# Patient Record
Sex: Female | Born: 1970
Health system: Southern US, Community
[De-identification: ages and names within clinical notes are randomized; demographics above are authoritative.]

## PROBLEM LIST (undated history)

## (undated) DIAGNOSIS — G4700 Insomnia, unspecified: Secondary | ICD-10-CM

## (undated) DIAGNOSIS — I10 Essential (primary) hypertension: Secondary | ICD-10-CM

## (undated) DIAGNOSIS — K529 Noninfective gastroenteritis and colitis, unspecified: Secondary | ICD-10-CM

## (undated) DIAGNOSIS — F419 Anxiety disorder, unspecified: Secondary | ICD-10-CM

## (undated) DIAGNOSIS — N289 Disorder of kidney and ureter, unspecified: Secondary | ICD-10-CM

## (undated) DIAGNOSIS — F4310 Post-traumatic stress disorder, unspecified: Secondary | ICD-10-CM

## (undated) HISTORY — PX: HIP SURGERY: SHX245

## (undated) HISTORY — PX: TUBAL LIGATION: SHX77

## (undated) HISTORY — PX: BACK SURGERY: SHX140

---

## 2019-12-19 ENCOUNTER — Other Ambulatory Visit: Payer: Self-pay

## 2019-12-20 ENCOUNTER — Telehealth (INDEPENDENT_AMBULATORY_CARE_PROVIDER_SITE_OTHER): Payer: Self-pay | Admitting: Medical

## 2019-12-20 DIAGNOSIS — I10 Essential (primary) hypertension: Secondary | ICD-10-CM | POA: Insufficient documentation

## 2019-12-20 DIAGNOSIS — F32A Depression, unspecified: Secondary | ICD-10-CM

## 2019-12-20 DIAGNOSIS — F419 Anxiety disorder, unspecified: Secondary | ICD-10-CM

## 2019-12-20 DIAGNOSIS — Q639 Congenital malformation of kidney, unspecified: Secondary | ICD-10-CM

## 2019-12-20 DIAGNOSIS — F329 Major depressive disorder, single episode, unspecified: Secondary | ICD-10-CM

## 2019-12-20 MED ORDER — FLUTICASONE PROPIONATE 50 MCG/ACT NA SUSP: 2.0000 | Freq: Every day | NASAL | 1 refills | Status: DC

## 2019-12-20 MED ORDER — TRAZODONE HCL 50 MG PO TABS: ORAL_TABLET | ORAL | 3 refills | Status: DC

## 2019-12-20 NOTE — Patient Instructions (Addendum)
For your htn- need to get bp level. Please call and give [place] bp and pulse update. She has tabs and refills.   For depression and insomnia continue cymbalta and continue trazadone. Will refill dose you have been using.  For anxiety- you need to just take xanax 1 mg twice daily prn. For me to fill in the future we needs uds and for you to sign controlled med contract  For pain hip and back limited options as your are on high dose benzo so narcotics usually not recommended. Will need to consider other option.   For allergies by description. Continue allegra and rx flonase.  Follow up 2 weeks or as needed

## 2019-12-20 NOTE — Progress Notes (Addendum)
Subjective:    Patient ID: Wendy Cooper, female    DOB: 1970/11/27, 49 y.o.   MRN: [id number]  HPI  Virtual Visit via Video Note  I connected with Wendy Cooper on 01/05/20 at  1:40 PM EDT by a video enabled telemedicine application and verified that I am speaking with the correct person using two identifiers.  Location: Patient: home Provider: office   I discussed the limitations of evaluation and management by telemedicine and the availability of in person appointments. The patient expressed understanding and agreed to proceed.     Follow Up Instructions:    I discussed the assessment and treatment plan with the patient. The patient was provided an opportunity to ask questions and all were answered. The patient agreed with the plan and demonstrated an understanding of the instructions.   The patient was advised to call back or seek an in-person evaluation if the symptoms worsen or if the condition fails to improve as anticipated.  I   Mackie Pai, PA-C   Pt just moved to [place]. Pt friend recommended her.  Pt has htn but has no bp cuff at home. She will update me on bp level when family member comes back. Lisinopril/hctz 20-12.5 mg q day.  Pt has rt hip pain for 8 years. Was on oxycodone in recent past but states no med for about 2 months. Lumbar surgery in past. Hip surgery but no replacement. Surgery was for hip fracture playing soccer years ago.  Anxiety history- Pt had been on and off of that for years. Had breaks periodically. It is 2 mg tablet. This was given by [facility]. She has 58 tabs 2 refill and states bottle states bid. She is taking 1/2 tab bid prn anxiety.  Pt has history of depression and has some PTSD and is on cymbalta 60 mg bid and trazadone 100 mg at night for sleep.  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   Review of Systems  Constitutional: Negative for chills, fatigue and fever.  HENT: Positive for congestion.          Runny nose. Tested negative for covid 11-26-2019. Taking allegra otc.   Respiratory: Negative for cough, chest tightness, shortness of breath and wheezing.   Cardiovascular: Negative for chest pain and palpitations.  Gastrointestinal: Negative for abdominal pain.  Musculoskeletal:       See hpi.  Skin: Negative for rash.  Neurological: Negative for dizziness, weakness, numbness and headaches.  Hematological: Negative for adenopathy. Does not bruise/bleed easily.  Psychiatric/Behavioral: Positive for dysphoric mood. The patient is nervous/anxious.     No past medical history on file.   Social History   Socioeconomic History  . Marital status: Divorced    Spouse name: Not on file  . Number of children: Not on file  . Years of education: Not on file  . Highest education level: Not on file  Occupational History  . Not on file  Tobacco Use  . Smoking status: Not on file  Substance and Sexual Activity  . Alcohol use: Not on file  . Drug use: Not on file  . Sexual activity: Not on file  Other Topics Concern  . Not on file  Social History Narrative  . Not on file   Social Determinants of Health   Financial Resource Strain:   . Difficulty of Paying Living Expenses:   Food Insecurity:   . Worried About Charity fundraiser in the Last Year:   .  Ran Out of Food in the Last Year:   Transportation Needs:   . Freight forwarder (Medical):   Marland Kitchen Lack of Transportation (Non-Medical):   Physical Activity:   . Days of Exercise per Week:   . Minutes of Exercise per Session:   Stress:   . Feeling of Stress :   Social Connections:   . Frequency of Communication with Friends and Family:   . Frequency of Social Gatherings with Friends and Family:   . Attends Religious Services:   . Active Member of Clubs or Organizations:   . Attends Banker Meetings:   Marland Kitchen Marital Status:   Intimate Partner Violence:   . Fear of Current or Ex-Partner:   . Emotionally Abused:    Marland Kitchen Physically Abused:   . Sexually Abused:      No family history on file.  Not on File  No current outpatient medications on file prior to visit.   No current facility-administered medications on file prior to visit.    There were no vitals taken for this visit.      Objective:   Physical Exam  General-no acute distress, pleasant, oriented. Lungs- on inspection lungs appear unlabored. Neck- no tracheal deviation or jvd on inspection. Neuro- gross motor function appears intact.             Assessment & Plan:   For your htn- need to get bp level. Please call and give [place] bp and pulse update. She has tabs and refills.   For depression and insomnia continue cymbalta and continue trazadone. Will refill dose you have been using.  For anxiety- you need to just take xanax 1 mg twice daily prn. For me to fill in the future we needs uds and for you to sign controlled med contract  For pain hip and back limited options as your are on high dose benzo so narcotics usually not recommended. Will need to consider other option.   For allergies by description. Continue allegra and rx flonase.  Follow up 2 weeks. or as needed  Time spent with patient today was 40   minutes which consisted  discussing diagnosis, work up, treatment and documentation.

## 2020-01-13 ENCOUNTER — Emergency Department (HOSPITAL_COMMUNITY): Payer: Medicaid - Out of State

## 2020-01-13 ENCOUNTER — Other Ambulatory Visit: Payer: Self-pay

## 2020-01-13 ENCOUNTER — Encounter (HOSPITAL_COMMUNITY): Payer: Self-pay

## 2020-01-13 ENCOUNTER — Emergency Department (HOSPITAL_COMMUNITY)
Admission: EM | Admit: 2020-01-13 | Discharge: 2020-01-13 | Disposition: A | Discharge status: Home / Self Care | Payer: Medicaid - Out of State | Attending: Emergency Medicine | Admitting: Emergency Medicine

## 2020-01-13 DIAGNOSIS — F17210 Nicotine dependence, cigarettes, uncomplicated: Secondary | ICD-10-CM | POA: Diagnosis not present

## 2020-01-13 DIAGNOSIS — I10 Essential (primary) hypertension: Secondary | ICD-10-CM | POA: Insufficient documentation

## 2020-01-13 DIAGNOSIS — M79604 Pain in right leg: Secondary | ICD-10-CM | POA: Diagnosis not present

## 2020-01-13 DIAGNOSIS — Z91040 Latex allergy status: Secondary | ICD-10-CM | POA: Insufficient documentation

## 2020-01-13 HISTORY — DX: Anxiety disorder, unspecified: F41.9

## 2020-01-13 HISTORY — DX: Post-traumatic stress disorder, unspecified: F43.10

## 2020-01-13 HISTORY — DX: Insomnia, unspecified: G47.00

## 2020-01-13 HISTORY — DX: Disorder of kidney and ureter, unspecified: N28.9

## 2020-01-13 HISTORY — DX: Essential (primary) hypertension: I10

## 2020-01-13 MED ORDER — IBUPROFEN 400 MG PO TABS: 400.0000 mg | ORAL_TABLET | Freq: Three times a day (TID) | ORAL | 0 refills | Status: AC

## 2020-01-13 MED ORDER — HYDROCODONE-ACETAMINOPHEN 5-325 MG PO TABS
1.0000 | ORAL_TABLET | Freq: Once | ORAL | Status: AC
  Administered 2020-01-13: 1 via ORAL
  Filled 2020-01-13: qty 1

## 2020-01-13 MED ORDER — HYDROCODONE-ACETAMINOPHEN 5-325 MG PO TABS: 1.0000 | ORAL_TABLET | Freq: Four times a day (QID) | ORAL | 0 refills | Status: DC | PRN

## 2020-01-13 MED ORDER — IBUPROFEN 800 MG PO TABS
800.0000 mg | ORAL_TABLET | Freq: Once | ORAL | Status: AC
  Administered 2020-01-13: 20:00:00 800 mg via ORAL
  Filled 2020-01-13: qty 1

## 2020-01-13 NOTE — ED Triage Notes (Signed)
49 yo female from home c/o right leg pain since 2am but worse in the last 5 hours. Per EMS, pt worked a double shift yesterday which may have caused discomfort. Denies fall or trauma. +PMS per EMS.   Hx of surgeries to right leg, hernia, HTN, insomnia.   All: Sulfa

## 2020-01-13 NOTE — ED Provider Notes (Signed)
[facility] DEPT Provider Note   CSN: [id number] Arrival date & time: 01/13/20  1844     History Chief Complaint  Patient presents with  . Leg Injury    Wendy Cooper is a 49 y.o. female.  HPI    Patient with a history of low back pain, prior surgeries, now presents with concern of pain in the right low back, right thigh.  No new trauma, fall, accident. 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 She notes that during that time, and since she has had severe pain in the right hip, low back, laterally, worse with activity, minimally better at rest. Though the pain is severe, sharp, persistent, she has taken no medication for pain relief. No other associated symptoms including fever, abdominal pain, incontinence or distal loss of sensation. Patient notes her history is notable for prior accidents, requiring multiple surgeries, both in her low back and leg. She has recently moved to this area, has no physicians here. Past Medical History:  Diagnosis Date  . Anxiety   . Hypertension   . Insomnia   . PTSD (post-traumatic stress disorder)   . Renal disorder     Patient Active Problem List   Diagnosis Date Noted  . Essential hypertension 12/20/2019      OB History   No obstetric history on file.     No family history on file.  Social History   Tobacco Use  . Smoking status: Current Every Day Smoker    Packs/day: 0.50    Types: Cigarettes  . Smokeless tobacco: Never Used  Substance Use Topics  . Alcohol use: Yes    Alcohol/week: 1.0 standard drinks    Types: 1 Cans of beer per week  . Drug use: Not Currently    Home Medications Prior to Admission medications   Medication Sig Start Date End Date Taking? Authorizing Provider  fluticasone (FLONASE) 50 MCG/ACT nasal spray Place 2 sprays into both nostrils daily. 12/20/19   Saguier, Percell Miller, PA-C  traZODone  (DESYREL) 50 MG tablet 1-2 tab po hs prn insomnia 12/20/19   Saguier, Percell Miller, PA-C    Allergies    Latex and Sulfa antibiotics  Review of Systems   Review of Systems  Constitutional:       Per HPI, otherwise negative  HENT:       Per HPI, otherwise negative  Respiratory:       Per HPI, otherwise negative  Cardiovascular:       Per HPI, otherwise negative  Gastrointestinal: Negative for vomiting.  Endocrine:       Negative aside from HPI  Genitourinary:       Neg aside from HPI   Musculoskeletal:       Per HPI, otherwise negative  Skin: Negative.   Neurological: Negative for syncope.    Physical Exam Updated Vital Signs BP 126/90   Pulse 79   Temp 97.9 F (36.6 C) (Oral)   Resp 18   Ht 5\' 8"  (1.727 m)   Wt 104.3 kg   LMP 12/14/2019 Comment: tubal ligation  SpO2 100%   BMI 34.97 kg/m   Physical Exam Vitals and nursing note reviewed.  Constitutional:      General: She is not in acute distress.    Appearance: She is well-developed.  HENT:     Head: Normocephalic and atraumatic.  Eyes:     Conjunctiva/sclera: Conjunctivae normal.  Cardiovascular:  Rate and Rhythm: Normal rate and regular rhythm.     Pulses: Normal pulses.  Pulmonary:     Effort: Pulmonary effort is normal. No respiratory distress.     Breath sounds: No stridor.  Abdominal:     Comments: Protuberant abdomen  Musculoskeletal:       Legs:  Skin:    General: Skin is warm and dry.  Neurological:     Mental Status: She is alert and oriented to person, place, and time.     Cranial Nerves: No cranial nerve deficit.     ED Results / Procedures / Treatments    Radiology DG Lumbar Spine 2-3 Views  Result Date: 01/13/2020 CLINICAL DATA:  Right hip and low back pain, right leg pain EXAM: LUMBAR SPINE - 2-3 VIEW COMPARISON:  None. FINDINGS: Frontal and lateral views of the lumbar spine demonstrate 5 non-rib-bearing lumbar type vertebral bodies with mild left convex curvature centered at L3.  Postsurgical changes are seen from discectomy and posterior fusion at L5/S1. No acute fractures. Mild facet hypertrophy at L4/L5. Mild disc space narrowing and anterior osteophyte formation at L1/L2 and L2/L3. Sacroiliac joints are normal. IMPRESSION: 1. No acute bony abnormality. 2. Mild upper lumbar spondylosis. 3. Postsurgical changes lumbosacral junction. Electronically Signed   By: Sharlet Salina M.D.   On: 01/13/2020 19:44   DG Hip Unilat W or Wo Pelvis 2-3 Views Right  Result Date: 01/13/2020 CLINICAL DATA:  Right leg pain EXAM: DG HIP (WITH OR WITHOUT PELVIS) 2-3V RIGHT COMPARISON:  None. FINDINGS: Frontal view of the pelvis as well as frontal and frogleg lateral views of the right hip are obtained. No fracture, subluxation, or dislocation. Joint spaces are well preserved. Postsurgical changes of the lumbosacral junction. IMPRESSION: 1. Unremarkable pelvis and right hip. Electronically Signed   By: Sharlet Salina M.D.   On: 01/13/2020 19:45    Procedures Procedures (including critical care time)  Medications Ordered in ED Medications  ibuprofen (ADVIL) tablet 800 mg (800 mg Oral Given 01/13/20 1940)  HYDROcodone-acetaminophen (NORCO/VICODIN) 5-325 MG per tablet 1 tablet (1 tablet Oral Given 01/13/20 1940)    ED Course  I have reviewed the triage vital signs and the nursing notes.  Pertinent labs & imaging results that were available during my care of the patient were reviewed by me and considered in my medical decision making (see chart for details).    9:02 PM Patient awake, alert, hemodynamically unremarkable. We discussed x-rays which I have reviewed. There are some evidence for degenerative changes, but no acute fracture.  Patient is distally neurovascularly intact, he has no red flags suggesting occult CNS lesions, no cutaneous lesions or fever suggesting infection, there are some suspicion for musculoskeletal versus radicular etiology for her pain.  Patient advised on importance of  following with orthopedic colleagues, rest, medication, and discharged in stable condition. Final Clinical Impression(s) / ED Diagnoses Final diagnoses:  Right leg pain    Rx / DC Orders ED Discharge Orders         Ordered    ibuprofen (ADVIL) 400 MG tablet  3 times daily     01/13/20 2101    HYDROcodone-acetaminophen (NORCO/VICODIN) 5-325 MG tablet  Every 6 hours PRN     01/13/20 2101           Gerhard Munch, MD 01/13/20 2103

## 2020-01-13 NOTE — Discharge Instructions (Signed)
As discussed, today's evaluation has been generally reassuring.  There are degenerative changes that are visible on your x-rays, but no broken bones, and your nerves are working appropriately.  However, with your pain, history of prior surgery, please schedule follow-up with our orthopedic colleagues.  Return here for concerning changes in your condition.

## 2020-01-13 NOTE — ED Notes (Signed)
Per dr Jeraldine Loots okay to extend time off work. New note written.

## 2020-02-05 ENCOUNTER — Other Ambulatory Visit: Payer: Self-pay | Admitting: Medical

## 2020-05-23 ENCOUNTER — Encounter (HOSPITAL_BASED_OUTPATIENT_CLINIC_OR_DEPARTMENT_OTHER): Payer: Self-pay | Admitting: *Deleted

## 2020-05-23 ENCOUNTER — Other Ambulatory Visit: Payer: Self-pay

## 2020-05-23 ENCOUNTER — Emergency Department (HOSPITAL_BASED_OUTPATIENT_CLINIC_OR_DEPARTMENT_OTHER)
Admission: EM | Admit: 2020-05-23 | Discharge: 2020-05-23 | Disposition: A | Discharge status: Home / Self Care | Payer: 59 | Attending: Emergency Medicine | Admitting: Emergency Medicine

## 2020-05-23 DIAGNOSIS — I10 Essential (primary) hypertension: Secondary | ICD-10-CM | POA: Insufficient documentation

## 2020-05-23 DIAGNOSIS — Z20822 Contact with and (suspected) exposure to covid-19: Secondary | ICD-10-CM | POA: Diagnosis not present

## 2020-05-23 DIAGNOSIS — R110 Nausea: Secondary | ICD-10-CM | POA: Diagnosis not present

## 2020-05-23 DIAGNOSIS — R519 Headache, unspecified: Secondary | ICD-10-CM | POA: Diagnosis not present

## 2020-05-23 DIAGNOSIS — F17210 Nicotine dependence, cigarettes, uncomplicated: Secondary | ICD-10-CM | POA: Insufficient documentation

## 2020-05-23 DIAGNOSIS — R5383 Other fatigue: Secondary | ICD-10-CM | POA: Insufficient documentation

## 2020-05-23 DIAGNOSIS — Z79899 Other long term (current) drug therapy: Secondary | ICD-10-CM | POA: Diagnosis not present

## 2020-05-23 DIAGNOSIS — Z91040 Latex allergy status: Secondary | ICD-10-CM | POA: Insufficient documentation

## 2020-05-23 LAB — CBG MONITORING, ED: Glucose-Capillary: 159 mg/dL — ABNORMAL HIGH (ref 70–99)

## 2020-05-23 LAB — SARS CORONAVIRUS 2 BY RT PCR (HOSPITAL ORDER, PERFORMED IN ~~LOC~~ HOSPITAL LAB): SARS Coronavirus 2: NEGATIVE

## 2020-05-23 MED ORDER — ONDANSETRON 4 MG PO TBDP
4.0000 mg | ORAL_TABLET | Freq: Three times a day (TID) | ORAL | 0 refills | Status: DC | PRN
Start: 2020-05-23 — End: 2020-10-16

## 2020-05-23 NOTE — ED Triage Notes (Signed)
Weakness, chills, sore throat, vomiting, body aches, loss of taste and fatigue.

## 2020-05-23 NOTE — ED Provider Notes (Signed)
[facility] EMERGENCY DEPARTMENT Provider Note   CSN: [id number] Arrival date & time: 05/23/20  1127     History No chief complaint on file.   Wendy Cooper is a 49 y.o. female.  The history is provided by the patient. No language interpreter was used.   Wendy Cooper is a 49 y.o. female who presents to the Emergency Department complaining of possible COVID-19. She presents the emergency department complaining of headache, nausea, scratchy throat, body aches, fatigue, loss of taste that started four days ago after she went to the [employer]. She has associated vomiting and diarrhea. No significant cough. They reports of fevers but she does not have a thermometer at home. She has been vaccinated for COVID-19 with the majority vaccine over the summer. No known sick contacts. She has a history of hypertension. Symptoms are moderate and constant nature.    Past Medical History:  Diagnosis Date  . Anxiety   . Hypertension   . Insomnia   . PTSD (post-traumatic stress disorder)   . Renal disorder     Patient Active Problem List   Diagnosis Date Noted  . Essential hypertension 12/20/2019    Past Surgical History:  Procedure Laterality Date  . BACK SURGERY       OB History   No obstetric history on file.     No family history on file.  Social History   Tobacco Use  . Smoking status: Current Every Day Smoker    Packs/day: 0.50    Types: Cigarettes  . Smokeless tobacco: Never Used  Substance Use Topics  . Alcohol use: Yes    Alcohol/week: 1.0 standard drink    Types: 1 Cans of beer per week  . Drug use: Not Currently    Home Medications Prior to Admission medications   Medication Sig Start Date End Date Taking? Authorizing Provider  alprazolam Prudy Feeler) 2 MG tablet Take by mouth. 03/08/20  Yes [provider]  DULoxetine (CYMBALTA) 60 MG capsule Take by mouth. 02/18/20  Yes [provider]  lisinopril-hydrochlorothiazide (ZESTORETIC)  20-12.5 MG tablet Take 1 tablet by mouth daily. 02/18/20  Yes [provider]  mirtazapine (REMERON) 15 MG tablet Take by mouth. 03/11/20  Yes [provider]  oxyCODONE-acetaminophen (PERCOCET) 10-325 MG tablet Take 1 tablet by mouth every 8 (eight) hours as needed. 02/26/20  Yes [provider]  fluticasone (FLONASE) 50 MCG/ACT nasal spray Place 2 sprays into both nostrils daily. 12/20/19   Saguier, Ramon Dredge, PA-C  HYDROcodone-acetaminophen (NORCO/VICODIN) 5-325 MG tablet Take 1 tablet by mouth every 6 (six) hours as needed for severe pain. 01/13/20   Gerhard Munch, MD  ondansetron (ZOFRAN ODT) 4 MG disintegrating tablet Take 1 tablet (4 mg total) by mouth every 8 (eight) hours as needed for nausea or vomiting. 05/23/20   Tilden Fossa, MD  traZODone (DESYREL) 50 MG tablet TAKE 1 TO 2 TABLETS AT BEDTIME AS NEEDED FOR INSOMNIA 02/06/20   Saguier, Ramon Dredge, PA-C    Allergies    Latex and Sulfa antibiotics  Review of Systems   Review of Systems  All other systems reviewed and are negative.   Physical Exam Updated Vital Signs BP 127/78   Pulse 77   Temp 97.8 F (36.6 C) (Oral)   Resp 14   Ht 5\' 8"  (1.727 m)   Wt 106.6 kg   SpO2 98%   BMI 35.73 kg/m   Physical Exam Vitals and nursing note reviewed.  Constitutional:      Appearance: She  is well-developed.  HENT:     Head: Normocephalic and atraumatic.  Cardiovascular:     Rate and Rhythm: Normal rate and regular rhythm.     Heart sounds: No murmur heard.   Pulmonary:     Effort: Pulmonary effort is normal. No respiratory distress.     Breath sounds: Normal breath sounds.  Abdominal:     Palpations: Abdomen is soft.     Tenderness: There is no abdominal tenderness. There is no guarding or rebound.  Musculoskeletal:        General: No tenderness.     Cervical back: Neck supple.  Skin:    General: Skin is warm and dry.  Neurological:     Mental Status: She is alert and oriented to person, place, and  time.  Psychiatric:        Behavior: Behavior normal.     ED Results / Procedures / Treatments   Labs (all labs ordered are listed, but only abnormal results are displayed) Labs Reviewed  SARS CORONAVIRUS 2 BY RT PCR (HOSPITAL ORDER, PERFORMED IN [facility])  CBG MONITORING, ED    EKG None  Radiology No results found.  Procedures Procedures (including critical care time)  Medications Ordered in ED Medications - No data to display  ED Course  I have reviewed the triage vital signs and the nursing notes.  Pertinent labs & imaging results that were available during my care of the patient were reviewed by me and considered in my medical decision making (see chart for details).    MDM Rules/Calculators/A&P                         patient here for evaluation of headache, fatigue, body aches, scratchy throat. She is non-toxic appearing on evaluation with no respiratory distress. She has been fully vaccinated for COVID-19. Discussed with patient concern for breakthrough COVID-19 infection. Discussed symptomatic treatment at home with rest, oral fluid hydration. Discussed outpatient follow-up and return precautions.  Presentation is not consistent with serious bacterial infection, sepsis.  Final Clinical Impression(s) / ED Diagnoses Final diagnoses:  Suspected COVID-19 virus infection  Fatigue, unspecified type    Rx / DC Orders ED Discharge Orders         Ordered    ondansetron (ZOFRAN ODT) 4 MG disintegrating tablet  Every 8 hours PRN        05/23/20 1221           Tilden Fossa, MD 05/23/20 1229

## 2020-07-30 ENCOUNTER — Emergency Department (HOSPITAL_COMMUNITY): Payer: 59

## 2020-07-30 ENCOUNTER — Emergency Department (HOSPITAL_COMMUNITY)
Admission: EM | Admit: 2020-07-30 | Discharge: 2020-07-30 | Disposition: A | Discharge status: Home / Self Care | Payer: 59 | Attending: Emergency Medicine | Admitting: Emergency Medicine

## 2020-07-30 ENCOUNTER — Encounter (HOSPITAL_COMMUNITY): Payer: Self-pay

## 2020-07-30 DIAGNOSIS — M719 Bursopathy, unspecified: Secondary | ICD-10-CM | POA: Diagnosis not present

## 2020-07-30 DIAGNOSIS — M25551 Pain in right hip: Secondary | ICD-10-CM | POA: Diagnosis not present

## 2020-07-30 DIAGNOSIS — F17210 Nicotine dependence, cigarettes, uncomplicated: Secondary | ICD-10-CM | POA: Insufficient documentation

## 2020-07-30 DIAGNOSIS — M25559 Pain in unspecified hip: Secondary | ICD-10-CM | POA: Diagnosis present

## 2020-07-30 DIAGNOSIS — I10 Essential (primary) hypertension: Secondary | ICD-10-CM | POA: Diagnosis not present

## 2020-07-30 DIAGNOSIS — M545 Low back pain, unspecified: Secondary | ICD-10-CM | POA: Diagnosis not present

## 2020-07-30 LAB — CBC
HCT: 41.8 % (ref 36.0–46.0)
Hemoglobin: 14.1 g/dL (ref 12.0–15.0)
MCH: 31 pg (ref 26.0–34.0)
MCHC: 33.7 g/dL (ref 30.0–36.0)
MCV: 91.9 fL (ref 80.0–100.0)
Platelets: 289 10*3/uL (ref 150–400)
RBC: 4.55 MIL/uL (ref 3.87–5.11)
RDW: 13.3 % (ref 11.5–15.5)
WBC: 11.9 10*3/uL — ABNORMAL HIGH (ref 4.0–10.5)
nRBC: 0 % (ref 0.0–0.2)

## 2020-07-30 LAB — BASIC METABOLIC PANEL
Anion gap: 10 (ref 5–15)
BUN: 23 mg/dL — ABNORMAL HIGH (ref 6–20)
CO2: 28 mmol/L (ref 22–32)
Calcium: 9.9 mg/dL (ref 8.9–10.3)
Chloride: 100 mmol/L (ref 98–111)
Creatinine, Ser: 0.85 mg/dL (ref 0.44–1.00)
GFR, Estimated: >60 mL/min (ref >60)
Glucose, Bld: 100 mg/dL — ABNORMAL HIGH (ref 70–99)
Potassium: 3.8 mmol/L (ref 3.5–5.1)
Sodium: 138 mmol/L (ref 135–145)

## 2020-07-30 MED ORDER — HYDROMORPHONE HCL 1 MG/ML IJ SOLN
1.0000 mg | Freq: Once | INTRAMUSCULAR | Status: AC
  Administered 2020-07-30: 1 mg via INTRAVENOUS
  Filled 2020-07-30: qty 1

## 2020-07-30 MED ORDER — HYDROCODONE-ACETAMINOPHEN 5-325 MG PO TABS: 1.0000 | ORAL_TABLET | ORAL | 0 refills | Status: DC | PRN

## 2020-07-30 MED ORDER — NAPROXEN 500 MG PO TABS
500.0000 mg | ORAL_TABLET | Freq: Two times a day (BID) | ORAL | 0 refills | Status: DC
Start: 2020-07-30 — End: 2021-02-20

## 2020-07-30 NOTE — ED Triage Notes (Signed)
Pt presents with c/o right hip pain, has a rod in place in her hip. Pt reports she is used to hip pain but this time, the pain is different. Pt reports she began to feel the stabbing pain earlier today.

## 2020-07-30 NOTE — Discharge Instructions (Addendum)
You were evaluated in the Emergency Department and after careful evaluation, we did not find any emergent condition requiring admission or further testing in the hospital.  Your exam/testing today was overall reassuring.  Your x-rays did not show any broken bones or emergencies.  Your pain may be related to bursitis.  Please take the Naprosyn anti-inflammatory as directed.  You can use the Norco tablets as needed for more significant pain.  Please return to the Emergency Department if you experience any worsening of your condition.  Thank you for allowing [place] to be a part of your care.

## 2020-07-30 NOTE — ED Provider Notes (Signed)
[facility]-EMERGENCY DEPT [facility] Emergency Department Provider Note MRN:  [id number]  Arrival date & time: 07/30/20     Chief Complaint   Hip Pain   History of Present Illness   Wendy Cooper is a 49 y.o. year-old female with a history of hypertension presenting to the ED with chief complaint of hip pain.  Sudden onset right lower back pain earlier today.  Trouble moving due to the pain.  Unsure if she feels weak in the right leg or just having pain keeping her from moving denies recent fever or illness, no chest pain or shortness of breath, no abdominal pain, no numbness to the legs, no bowel or bladder dysfunction.  Denies trauma.  Review of Systems  A complete 10 system review of systems was obtained and all systems are negative except as noted in the HPI and PMH.   Patient's Health History    Past Medical History:  Diagnosis Date  . Anxiety   . Hypertension   . Insomnia   . PTSD (post-traumatic stress disorder)   . Renal disorder     Past Surgical History:  Procedure Laterality Date  . BACK SURGERY      History reviewed. No pertinent family history.  Social History   Socioeconomic History  . Marital status: Divorced    Spouse name: Not on file  . Number of children: Not on file  . Years of education: Not on file  . Highest education level: Not on file  Occupational History  . Not on file  Tobacco Use  . Smoking status: Current Every Day Smoker    Packs/day: 0.50    Types: Cigarettes  . Smokeless tobacco: Never Used  Substance and Sexual Activity  . Alcohol use: Yes    Alcohol/week: 1.0 standard drink    Types: 1 Cans of beer per week  . Drug use: Not Currently  . Sexual activity: Not Currently  Other Topics Concern  . Not on file  Social History Narrative  . Not on file   Social Determinants of Health   Financial Resource Strain:   . Difficulty of Paying Living Expenses: Not on file  Food Insecurity:   . Worried About Programme researcher, broadcasting/film/video in the  Last Year: Not on file  . Ran Out of Food in the Last Year: Not on file  Transportation Needs:   . Lack of Transportation (Medical): Not on file  . Lack of Transportation (Non-Medical): Not on file  Physical Activity:   . Days of Exercise per Week: Not on file  . Minutes of Exercise per Session: Not on file  Stress:   . Feeling of Stress : Not on file  Social Connections:   . Frequency of Communication with Friends and Family: Not on file  . Frequency of Social Gatherings with Friends and Family: Not on file  . Attends Religious Services: Not on file  . Active Member of Clubs or Organizations: Not on file  . Attends Banker Meetings: Not on file  . Marital Status: Not on file  Intimate Partner Violence:   . Fear of Current or Ex-Partner: Not on file  . Emotionally Abused: Not on file  . Physically Abused: Not on file  . Sexually Abused: Not on file     Physical Exam   Vitals:   07/30/20 1704 07/30/20 1714  BP: 96/77   Pulse: (!) 102 (!) 102  Resp: 15   Temp:    SpO2: 99% 99%  CONSTITUTIONAL: Well-appearing, NAD NEURO:  Alert and oriented x 3, no focal deficits EYES:  eyes equal and reactive ENT/NECK:  no LAD, no JVD CARDIO: Regular rate, well-perfused, normal S1 and S2 PULM:  CTAB no wheezing or rhonchi GI/GU:  normal bowel sounds, non-distended, non-tender MSK/SPINE:  No gross deformities, no edema SKIN:  no rash, atraumatic PSYCH:  Appropriate speech and behavior  *Additional and/or pertinent findings included in MDM below  Diagnostic and Interventional Summary    EKG Interpretation  Date/Time:    Ventricular Rate:    PR Interval:    QRS Duration:   QT Interval:    QTC Calculation:   R Axis:     Text Interpretation:        Labs Reviewed  CBC - Abnormal; Notable for the following components:      Result Value   WBC 11.9 (*)    All other components within normal limits  BASIC METABOLIC PANEL - Abnormal; Notable for the following  components:   Glucose, Bld 100 (*)    BUN 23 (*)    All other components within normal limits    DG Lumbar Spine Complete  Final Result    DG Femur Min 2 Views Right  Final Result    DG Pelvis 1-2 Views  Final Result      Medications  HYDROmorphone (DILAUDID) injection 1 mg (1 mg Intravenous Given 07/30/20 1701)     Procedures  /  Critical Care Procedures  ED Course and Medical Decision Making  I have reviewed the triage vital signs, the nursing notes, and pertinent available records from the EMR.  Listed above are laboratory and imaging tests that I personally ordered, reviewed, and interpreted and then considered in my medical decision making (see below for details).  X-rays to evaluate for complication of prior surgery/hardware.  Providing pain medication and will reassess her neurological exam     On reassessment patient has no weakness or new numbness to warrant advanced imaging.  No bowel or bladder dysfunction, nothing to suggest myelopathy.  Her x-rays are unremarkable.  She is ambulating without issue, and on closer inspection her pain seems more pinpoint laterally on the hip, suggesting possible trochanteric bursitis.  Appropriate for discharge.  Elmer Sow. Pilar Plate, MD [facility] Emergency Medicine [facility] [email or web address]  Final Clinical Impressions(s) / ED Diagnoses     ICD-10-CM   1. Hip pain  M25.559   2. Bursitis, unspecified site  M71.9     ED Discharge Orders         Ordered    HYDROcodone-acetaminophen (NORCO/VICODIN) 5-325 MG tablet  Every 4 hours PRN        07/30/20 1817    naproxen (NAPROSYN) 500 MG tablet  2 times daily        07/30/20 1817           Discharge Instructions Discussed with and Provided to Patient:     Discharge Instructions     You were evaluated in the Emergency Department and after careful evaluation, we did not find any emergent condition requiring admission or further testing in the  hospital.  Your exam/testing today was overall reassuring.  Your x-rays did not show any broken bones or emergencies.  Your pain may be related to bursitis.  Please take the Naprosyn anti-inflammatory as directed.  You can use the Norco tablets as needed for more significant pain.  Please return to the Emergency Department if you experience any worsening of your condition.  Thank you for allowing [place] to be a part of your care.        Sabas Sous, MD 07/30/20 Rickey Primus

## 2020-08-16 ENCOUNTER — Other Ambulatory Visit: Payer: Self-pay

## 2020-08-16 ENCOUNTER — Emergency Department (HOSPITAL_COMMUNITY)
Admission: EM | Admit: 2020-08-16 | Discharge: 2020-08-16 | Disposition: A | Discharge status: Home / Self Care | Payer: 59 | Attending: Emergency Medicine | Admitting: Emergency Medicine

## 2020-08-16 ENCOUNTER — Encounter (HOSPITAL_COMMUNITY): Payer: Self-pay

## 2020-08-16 DIAGNOSIS — M545 Low back pain, unspecified: Secondary | ICD-10-CM | POA: Insufficient documentation

## 2020-08-16 DIAGNOSIS — Z79899 Other long term (current) drug therapy: Secondary | ICD-10-CM | POA: Insufficient documentation

## 2020-08-16 DIAGNOSIS — M25551 Pain in right hip: Secondary | ICD-10-CM | POA: Insufficient documentation

## 2020-08-16 DIAGNOSIS — F17210 Nicotine dependence, cigarettes, uncomplicated: Secondary | ICD-10-CM | POA: Diagnosis not present

## 2020-08-16 DIAGNOSIS — I10 Essential (primary) hypertension: Secondary | ICD-10-CM | POA: Insufficient documentation

## 2020-08-16 DIAGNOSIS — Z91040 Latex allergy status: Secondary | ICD-10-CM | POA: Insufficient documentation

## 2020-08-16 MED ORDER — KETOROLAC TROMETHAMINE 30 MG/ML IJ SOLN
15.0000 mg | Freq: Once | INTRAMUSCULAR | Status: DC
  Filled 2020-08-16: qty 1

## 2020-08-16 MED ORDER — PREDNISONE 10 MG (21) PO TBPK
ORAL_TABLET | Freq: Every day | ORAL | 0 refills | Status: DC
Start: 2020-08-16 — End: 2021-02-20

## 2020-08-16 MED ORDER — LIDOCAINE 5 % EX PTCH: 1.0000 | MEDICATED_PATCH | CUTANEOUS | 0 refills | Status: DC

## 2020-08-16 MED ORDER — KETOROLAC TROMETHAMINE 60 MG/2ML IM SOLN
15.0000 mg | Freq: Once | INTRAMUSCULAR | Status: AC
  Administered 2020-08-16: 15 mg via INTRAMUSCULAR

## 2020-08-16 NOTE — ED Provider Notes (Signed)
[facility]-EMERGENCY DEPT Provider Note   CSN: [id number] Arrival date & time: 08/16/20  1443     History Chief Complaint  Patient presents with  . Hip Pain    Wendy Cooper is a 49 y.o. female.  HPI   49 y/o female with a h/o anxiety/depression, insomnia, PTSD, renal disorder, who presents to the ED Today for eval of lower back/right hip pain.  This has been present for several weeks.  She has a history of surgery to the lower back/right hip many years ago and has had intermittent pain since then.  She was seen 07/30/2020 for similar symptoms.  She had x-rays of the lumbar spine, pelvis and femur at that time which did not show any acute abnormalities.  She complains of a constant stabbing pain that radiates down the right lower extremity.  She has some chronic numbness to the right lateral thigh area which is unchanged and she has had some intermittent numbness to the left upper thigh now as well.  She denies any overt weakness.  Denies any loss control of bowel or bladder function.  No history of IV drug use, fevers or cancer.  Has an appointment with her prior surgeon later this month in [place], but does not have an established physician here in the area.  She tried anti-inflammatories, Flexeril without significant relief.  Past Medical History:  Diagnosis Date  . Anxiety   . Hypertension   . Insomnia   . PTSD (post-traumatic stress disorder)   . Renal disorder     Patient Active Problem List   Diagnosis Date Noted  . Essential hypertension 12/20/2019    Past Surgical History:  Procedure Laterality Date  . BACK SURGERY    . HIP SURGERY Right   . TUBAL LIGATION       OB History   No obstetric history on file.     Family History  Problem Relation Age of Onset  . Cancer Mother   . Alzheimer's disease Father     Social History   Tobacco Use  . Smoking status: Current Every Day Smoker    Packs/day: 0.50    Types: Cigarettes  . Smokeless  tobacco: Never Used  Vaping Use  . Vaping Use: Never used  Substance Use Topics  . Alcohol use: Not Currently    Alcohol/week: 1.0 standard drink    Types: 1 Cans of beer per week  . Drug use: Not Currently    Home Medications Prior to Admission medications   Medication Sig Start Date End Date Taking? Authorizing Provider  alprazolam Prudy Feeler) 2 MG tablet Take by mouth. 03/08/20   [provider]  DULoxetine (CYMBALTA) 60 MG capsule Take by mouth. 02/18/20   [provider]  fluticasone (FLONASE) 50 MCG/ACT nasal spray Place 2 sprays into both nostrils daily. 12/20/19   Saguier, Ramon Dredge, PA-C  HYDROcodone-acetaminophen (NORCO/VICODIN) 5-325 MG tablet Take 1 tablet by mouth every 4 (four) hours as needed. 07/30/20   Sabas Sous, MD  lidocaine (LIDODERM) 5 % Place 1 patch onto the skin daily. Remove & Discard patch within 12 hours or as directed by MD 08/16/20   Andrienne Havener S, PA-C  lisinopril-hydrochlorothiazide (ZESTORETIC) 20-12.5 MG tablet Take 1 tablet by mouth daily. 02/18/20   [provider]  mirtazapine (REMERON) 15 MG tablet Take by mouth. 03/11/20   [provider]  naproxen (NAPROSYN) 500 MG tablet Take 1 tablet (500 mg total) by mouth 2 (two) times daily. 07/30/20  Sabas Sous, MD  ondansetron (ZOFRAN ODT) 4 MG disintegrating tablet Take 1 tablet (4 mg total) by mouth every 8 (eight) hours as needed for nausea or vomiting. 05/23/20   Tilden Fossa, MD  oxyCODONE-acetaminophen (PERCOCET) 10-325 MG tablet Take 1 tablet by mouth every 8 (eight) hours as needed. 02/26/20   [provider]  predniSONE (STERAPRED UNI-PAK 21 TAB) 10 MG (21) TBPK tablet Take by mouth daily. Take 6 tabs by mouth daily  for 2 days, then 5 tabs for 2 days, then 4 tabs for 2 days, then 3 tabs for 2 days, 2 tabs for 2 days, then 1 tab by mouth daily for 2 days 08/16/20   Labresha Mellor S, PA-C  traZODone (DESYREL) 50 MG tablet TAKE 1 TO 2 TABLETS AT BEDTIME AS  NEEDED FOR INSOMNIA 02/06/20   Saguier, Ramon Dredge, PA-C    Allergies    Latex and Sulfa antibiotics  Review of Systems   Review of Systems  Constitutional: Negative for fever.  HENT: Negative for ear pain and sore throat.   Eyes: Negative for visual disturbance.  Respiratory: Negative for shortness of breath.   Cardiovascular: Negative for chest pain.  Gastrointestinal: Negative for abdominal pain and vomiting.  Genitourinary: Negative for dysuria and hematuria.  Musculoskeletal: Positive for back pain.       Right hip pain  Skin: Negative for rash.  Neurological: Negative for headaches.  All other systems reviewed and are negative.   Physical Exam Updated Vital Signs BP (!) 184/132 (BP Location: Left Arm)   Pulse (!) 106   Temp 98.6 F (37 C) (Oral)   Resp 16   Ht 5\' 8"  (1.727 m)   Wt 117.9 kg   LMP 05/17/2020 (Approximate)   SpO2 97%   BMI 39.53 kg/m   Physical Exam Vitals and nursing note reviewed.  Constitutional:      General: She is not in acute distress.    Appearance: She is well-developed and well-nourished.  HENT:     Head: Normocephalic and atraumatic.  Eyes:     Conjunctiva/sclera: Conjunctivae normal.  Cardiovascular:     Rate and Rhythm: Normal rate.  Pulmonary:     Effort: Pulmonary effort is normal.  Musculoskeletal:        General: No edema.     Cervical back: Neck supple.       Back:     Comments: TTP to the right lumbar paraspinous muscles and over the right sciatic notch.  Skin:    General: Skin is warm and dry.  Neurological:     Mental Status: She is alert.     Comments: Motor:  Normal tone. 5/5 strength of BUE and BLE major muscle groups including strong and equal grip strength and dorsiflexion/plantar flexion Sensory: decreased sensation to the right upper thigh (chronic), slightly decreased sensation to the left upper thigh area.   Psychiatric:        Mood and Affect: Mood and affect normal.     Comments: anxious     ED Results  / Procedures / Treatments   Labs (all labs ordered are listed, but only abnormal results are displayed) Labs Reviewed - No data to display  EKG None  Radiology No results found.  Procedures Procedures (including critical care time)  Medications Ordered in ED Medications - No data to display  ED Course  I have reviewed the triage vital signs and the nursing notes.  Pertinent labs & imaging results that were available during my care  of the patient were reviewed by me and considered in my medical decision making (see chart for details).    MDM Rules/Calculators/A&P                          Patient with back pain.  No neurological deficits and normal neuro exam.  Patient can walk but states is painful.  No loss of bowel or bladder control.  No concern for cauda equina.  No fever, night sweats, weight loss, h/o cancer, IVDU.  RICE protocol, steroids and lidocaine patches indicated and discussed with patient.    Final Clinical Impression(s) / ED Diagnoses Final diagnoses:  Acute low back pain, unspecified back pain laterality, unspecified whether sciatica present    Rx / DC Orders ED Discharge Orders         Ordered    predniSONE (STERAPRED UNI-PAK 21 TAB) 10 MG (21) TBPK tablet  Daily        08/16/20 1622    lidocaine (LIDODERM) 5 %  Every 24 hours        08/16/20 1622           Alec Mcphee S, PA-C 08/16/20 1626    Horton, Clabe Seal, DO 08/16/20 2359

## 2020-08-16 NOTE — ED Triage Notes (Signed)
Patient reports that she has had right hip surgery with hardware and continues to have pain. Patient was seen on 07/30/20 for the same.  Patient states she has numbness to the left thigh area. patient states she has taken naproxyn and Ibuprofen with no relief.

## 2020-08-16 NOTE — Discharge Instructions (Addendum)
Use steroids and lidoderm patches as directed.   Please follow up with your primary care provider within 5-7 days for re-evaluation of your symptoms. If you do not have a primary care provider, information for a healthcare clinic has been provided for you to make arrangements for follow up care.   Return to the emergency department immediately if you experience any back pain associated with fevers, loss of control of your bowels/bladder, weakness/numbness to your legs, numbness to your groin area, inability to walk, or inability to urinate.

## 2020-10-16 ENCOUNTER — Emergency Department (HOSPITAL_BASED_OUTPATIENT_CLINIC_OR_DEPARTMENT_OTHER): Payer: BLUE CROSS/BLUE SHIELD

## 2020-10-16 ENCOUNTER — Emergency Department (HOSPITAL_BASED_OUTPATIENT_CLINIC_OR_DEPARTMENT_OTHER)
Admission: EM | Admit: 2020-10-16 | Discharge: 2020-10-16 | Disposition: A | Discharge status: Home / Self Care | Payer: BLUE CROSS/BLUE SHIELD | Attending: Emergency Medicine | Admitting: Emergency Medicine

## 2020-10-16 ENCOUNTER — Encounter (HOSPITAL_BASED_OUTPATIENT_CLINIC_OR_DEPARTMENT_OTHER): Payer: Self-pay | Admitting: *Deleted

## 2020-10-16 ENCOUNTER — Other Ambulatory Visit: Payer: Self-pay

## 2020-10-16 DIAGNOSIS — R112 Nausea with vomiting, unspecified: Secondary | ICD-10-CM | POA: Diagnosis present

## 2020-10-16 DIAGNOSIS — F17210 Nicotine dependence, cigarettes, uncomplicated: Secondary | ICD-10-CM | POA: Diagnosis not present

## 2020-10-16 DIAGNOSIS — K529 Noninfective gastroenteritis and colitis, unspecified: Secondary | ICD-10-CM

## 2020-10-16 DIAGNOSIS — R1110 Vomiting, unspecified: Secondary | ICD-10-CM

## 2020-10-16 DIAGNOSIS — E860 Dehydration: Secondary | ICD-10-CM | POA: Insufficient documentation

## 2020-10-16 DIAGNOSIS — N289 Disorder of kidney and ureter, unspecified: Secondary | ICD-10-CM

## 2020-10-16 DIAGNOSIS — I10 Essential (primary) hypertension: Secondary | ICD-10-CM | POA: Diagnosis not present

## 2020-10-16 DIAGNOSIS — R197 Diarrhea, unspecified: Secondary | ICD-10-CM | POA: Insufficient documentation

## 2020-10-16 DIAGNOSIS — R109 Unspecified abdominal pain: Secondary | ICD-10-CM

## 2020-10-16 DIAGNOSIS — Z91040 Latex allergy status: Secondary | ICD-10-CM | POA: Insufficient documentation

## 2020-10-16 DIAGNOSIS — Z79899 Other long term (current) drug therapy: Secondary | ICD-10-CM | POA: Diagnosis not present

## 2020-10-16 LAB — CBC WITH DIFFERENTIAL/PLATELET
Abs Immature Granulocytes: 0.1 10*3/uL — ABNORMAL HIGH (ref 0.00–0.07)
Basophils Absolute: 0 10*3/uL (ref 0.0–0.1)
Basophils Relative: 0 %
Eosinophils Absolute: 0.2 10*3/uL (ref 0.0–0.5)
Eosinophils Relative: 2 %
HCT: 36.1 % (ref 36.0–46.0)
Hemoglobin: 12.3 g/dL (ref 12.0–15.0)
Immature Granulocytes: 1 %
Lymphocytes Relative: 17 %
Lymphs Abs: 2 10*3/uL (ref 0.7–4.0)
MCH: 29.7 pg (ref 26.0–34.0)
MCHC: 34.1 g/dL (ref 30.0–36.0)
MCV: 87.2 fL (ref 80.0–100.0)
Monocytes Absolute: 1.1 10*3/uL — ABNORMAL HIGH (ref 0.1–1.0)
Monocytes Relative: 9 %
Neutro Abs: 8.4 10*3/uL — ABNORMAL HIGH (ref 1.7–7.7)
Neutrophils Relative %: 71 %
Platelets: 239 10*3/uL (ref 150–400)
RBC: 4.14 MIL/uL (ref 3.87–5.11)
RDW: 12.8 % (ref 11.5–15.5)
Smear Review: NORMAL
WBC: 11.9 10*3/uL — ABNORMAL HIGH (ref 4.0–10.5)
nRBC: 0 % (ref 0.0–0.2)

## 2020-10-16 LAB — COMPREHENSIVE METABOLIC PANEL WITH GFR
ALT: 36 U/L (ref 0–44)
AST: 46 U/L — ABNORMAL HIGH (ref 15–41)
Albumin: 3.5 g/dL (ref 3.5–5.0)
Alkaline Phosphatase: 61 U/L (ref 38–126)
Anion gap: 12 (ref 5–15)
BUN: 54 mg/dL — ABNORMAL HIGH (ref 6–20)
CO2: 27 mmol/L (ref 22–32)
Calcium: 8.9 mg/dL (ref 8.9–10.3)
Chloride: 92 mmol/L — ABNORMAL LOW (ref 98–111)
Creatinine, Ser: 2.04 mg/dL — ABNORMAL HIGH (ref 0.44–1.00)
GFR, Estimated: 29 mL/min — ABNORMAL LOW
Glucose, Bld: 97 mg/dL (ref 70–99)
Potassium: 3.2 mmol/L — ABNORMAL LOW (ref 3.5–5.1)
Sodium: 131 mmol/L — ABNORMAL LOW (ref 135–145)
Total Bilirubin: 0.6 mg/dL (ref 0.3–1.2)
Total Protein: 7.2 g/dL (ref 6.5–8.1)

## 2020-10-16 LAB — LIPASE, BLOOD: Lipase: 15 U/L (ref 11–51)

## 2020-10-16 LAB — URINALYSIS, ROUTINE W REFLEX MICROSCOPIC
Bilirubin Urine: NEGATIVE
Glucose, UA: NEGATIVE mg/dL
Hgb urine dipstick: NEGATIVE
Ketones, ur: NEGATIVE mg/dL
Nitrite: NEGATIVE
Protein, ur: NEGATIVE mg/dL
Specific Gravity, Urine: 1.005 (ref 1.005–1.030)
pH: 6 (ref 5.0–8.0)

## 2020-10-16 LAB — URINALYSIS, MICROSCOPIC (REFLEX)

## 2020-10-16 MED ORDER — ONDANSETRON HCL 4 MG/2ML IJ SOLN
4.0000 mg | Freq: Once | INTRAMUSCULAR | Status: AC
  Administered 2020-10-16: 4 mg via INTRAVENOUS
  Filled 2020-10-16: qty 2

## 2020-10-16 MED ORDER — SODIUM CHLORIDE 0.9 % IV BOLUS
1000.0000 mL | Freq: Once | INTRAVENOUS | Status: AC
  Administered 2020-10-16: 1000 mL via INTRAVENOUS

## 2020-10-16 MED ORDER — ONDANSETRON 4 MG PO TBDP
ORAL_TABLET | ORAL | 0 refills | Status: AC
Start: 2020-10-16 — End: ?

## 2020-10-16 NOTE — ED Notes (Signed)
Patient transported to CT 

## 2020-10-16 NOTE — ED Triage Notes (Signed)
C/o n/d x 3 days , vomiting x 2 days ago with improvement.

## 2020-10-16 NOTE — ED Provider Notes (Signed)
[facility] EMERGENCY DEPARTMENT Provider Note   CSN: [id number] Arrival date & time: 10/16/20  1435     History Chief Complaint  Patient presents with  . Nausea    Wendy Cooper is a 50 y.o. female hx of HTN, PTSD, here with nausea vomiting diarrhea.  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  She states that 3 days ago, she went out to eat.  She then had nausea vomiting and diarrhea.  She states that the vomiting was black and the diarrhea was black as well.  Patient's last vomiting was yesterday.  Patient is not on any blood thinners.  Denies any abdominal pain.  Patient recently had COVID about a month ago.  Denies any fevers or chills or trouble breathing  The history is provided by the patient.       Past Medical History:  Diagnosis Date  . Anxiety   . Hypertension   . Insomnia   . PTSD (post-traumatic stress disorder)   . Renal disorder     Patient Active Problem List   Diagnosis Date Noted  . Essential hypertension 12/20/2019    Past Surgical History:  Procedure Laterality Date  . BACK SURGERY    . HIP SURGERY Right   . TUBAL LIGATION       OB History   No obstetric history on file.     Family History  Problem Relation Age of Onset  . Cancer Mother   . Alzheimer's disease Father     Social History   Tobacco Use  . Smoking status: Current Every Day Smoker    Packs/day: 0.50    Types: Cigarettes  . Smokeless tobacco: Never Used  Vaping Use  . Vaping Use: Never used  Substance Use Topics  . Alcohol use: Not Currently    Alcohol/week: 1.0 standard drink    Types: 1 Cans of beer per week  . Drug use: Not Currently    Home Medications Prior to Admission medications   Medication Sig Start Date End Date Taking? Authorizing Provider  alprazolam Prudy Feeler) 2 MG tablet Take by mouth. 03/08/20   [provider]  DULoxetine (CYMBALTA) 60 MG capsule Take by mouth. 02/18/20   [provider]   fluticasone (FLONASE) 50 MCG/ACT nasal spray Place 2 sprays into both nostrils daily. 12/20/19   Saguier, Ramon Dredge, PA-C  HYDROcodone-acetaminophen (NORCO/VICODIN) 5-325 MG tablet Take 1 tablet by mouth every 4 (four) hours as needed. 07/30/20   Sabas Sous, MD  lidocaine (LIDODERM) 5 % Place 1 patch onto the skin daily. Remove & Discard patch within 12 hours or as directed by MD 08/16/20   Couture, Cortni S, PA-C  lisinopril-hydrochlorothiazide (ZESTORETIC) 20-12.5 MG tablet Take 1 tablet by mouth daily. 02/18/20   [provider]  mirtazapine (REMERON) 15 MG tablet Take by mouth. 03/11/20   [provider]  naproxen (NAPROSYN) 500 MG tablet Take 1 tablet (500 mg total) by mouth 2 (two) times daily. 07/30/20   Sabas Sous, MD  ondansetron (ZOFRAN ODT) 4 MG disintegrating tablet Take 1 tablet (4 mg total) by mouth every 8 (eight) hours as needed for nausea or vomiting. 05/23/20   Tilden Fossa, MD  oxyCODONE-acetaminophen (PERCOCET) 10-325 MG tablet Take 1 tablet by mouth every 8 (eight) hours as needed. 02/26/20   [provider]  predniSONE (STERAPRED UNI-PAK 21 TAB) 10 MG (21) TBPK tablet Take by mouth daily. Take 6 tabs by mouth daily  for 2  days, then 5 tabs for 2 days, then 4 tabs for 2 days, then 3 tabs for 2 days, 2 tabs for 2 days, then 1 tab by mouth daily for 2 days 08/16/20   Couture, Cortni S, PA-C  traZODone (DESYREL) 50 MG tablet TAKE 1 TO 2 TABLETS AT BEDTIME AS NEEDED FOR INSOMNIA 02/06/20   Saguier, Ramon Dredge, PA-C    Allergies    Latex and Sulfa antibiotics  Review of Systems   Review of Systems  Gastrointestinal: Positive for diarrhea and vomiting.  All other systems reviewed and are negative.   Physical Exam Updated Vital Signs BP 93/62 (BP Location: Right Arm)   Pulse 92   Temp 97.6 F (36.4 C) (Oral)   Resp 20   Ht 5\' 8"  (1.727 m)   Wt 108.9 kg   SpO2 100%   BMI 36.49 kg/m   Physical Exam Vitals and nursing note reviewed.   Constitutional:      Comments: Dehydrated  HENT:     Head: Normocephalic.     Nose: Nose normal.     Mouth/Throat:     Mouth: Mucous membranes are dry.  Eyes:     Extraocular Movements: Extraocular movements intact.     Pupils: Pupils are equal, round, and reactive to light.  Cardiovascular:     Rate and Rhythm: Normal rate and regular rhythm.     Pulses: Normal pulses.     Heart sounds: Normal heart sounds.  Pulmonary:     Effort: Pulmonary effort is normal.     Breath sounds: Normal breath sounds.  Abdominal:     General: Abdomen is flat.     Palpations: Abdomen is soft.  Musculoskeletal:        General: Normal range of motion.     Cervical back: Normal range of motion and neck supple.  Skin:    General: Skin is warm.     Capillary Refill: Capillary refill takes less than 2 seconds.  Neurological:     General: No focal deficit present.     Mental Status: She is oriented to person, place, and time.  Psychiatric:        Mood and Affect: Mood normal.        Behavior: Behavior normal.     ED Results / Procedures / Treatments   Labs (all labs ordered are listed, but only abnormal results are displayed) Labs Reviewed  CBC WITH DIFFERENTIAL/PLATELET - Abnormal; Notable for the following components:      Result Value   WBC 11.9 (*)    Neutro Abs 8.4 (*)    Monocytes Absolute 1.1 (*)    Abs Immature Granulocytes 0.10 (*)    All other components within normal limits  COMPREHENSIVE METABOLIC PANEL - Abnormal; Notable for the following components:   Sodium 131 (*)    Potassium 3.2 (*)    Chloride 92 (*)    BUN 54 (*)    Creatinine, Ser 2.04 (*)    AST 46 (*)    GFR, Estimated 29 (*)    All other components within normal limits  URINALYSIS, ROUTINE W REFLEX MICROSCOPIC - Abnormal; Notable for the following components:   APPearance HAZY (*)    Leukocytes,Ua TRACE (*)    All other components within normal limits  URINALYSIS, MICROSCOPIC (REFLEX) - Abnormal; Notable for  the following components:   Bacteria, UA FEW (*)    All other components within normal limits  LIPASE, BLOOD    EKG None  Radiology CT  Renal Stone Study  Result Date: 10/16/2020 CLINICAL DATA:  Flank pain.  Vomiting.  Duration of symptoms 2 days. EXAM: CT ABDOMEN AND PELVIS WITHOUT CONTRAST TECHNIQUE: Multidetector CT imaging of the abdomen and pelvis was performed following the standard protocol without IV contrast. COMPARISON:  None. FINDINGS: Lower chest: Hiatal hernia. Mild linear scarring or atelectasis at both lung bases. Hepatobiliary: Normal Pancreas: Normal Spleen: Normal Adrenals/Urinary Tract: Adrenal glands are normal. Right kidney is normal. No cyst, mass, stone or hydronephrosis. 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 No sign of obstruction. Bladder is normal. Stomach/Bowel: No evidence of bowel obstruction. There is wall thickening of the colon beginning at about the splenic flexure, consistent with infectious colitis. Mild adjacent fat stranding. Diverticulosis of the left colon but without evidence of diverticulitis. Vascular/Lymphatic: Aortic atherosclerosis. No aneurysm. IVC is normal. No retroperitoneal adenopathy. Reproductive: Normal Other: No free fluid or air. Musculoskeletal: Previous L5-S1 fusion. Degenerative changes at L3-4 and L4-5. IMPRESSION: 1. 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 No sign of obstruction. 2. Wall thickening of the colon with adjacent fat edema from the splenic flexure through the left colon, most consistent with infectious colitis. 3. Hiatal hernia. 4. Aortic atherosclerosis. 5. Diverticulosis of the left colon without evidence of diverticulitis. 6. Previous L5-S1 fusion. Degenerative changes at L3-4 and L4-5. Aortic Atherosclerosis (ICD10-I70.0). Electronically Signed   By: Paulina Fusi M.D.   On: 10/16/2020 17:40     Procedures Procedures   Medications Ordered in ED Medications  sodium chloride 0.9 % bolus 1,000 mL (0 mLs Intravenous Stopped 10/16/20 1650)  ondansetron (ZOFRAN) injection 4 mg (4 mg Intravenous Given 10/16/20 1614)  sodium chloride 0.9 % bolus 1,000 mL (1,000 mLs Intravenous New Bag/Given 10/16/20 1653)    ED Course  I have reviewed the triage vital signs and the nursing notes.  Pertinent labs & imaging results that were available during my care of the patient were reviewed by me and considered in my medical decision making (see chart for details).    MDM Rules/Calculators/A&P                         Cailan Antonucci is a 50 y.o. female here presenting with vomiting and diarrhea.  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  She just had Covid a month ago and is afebrile and she is fully vaccinated.  I do not think she has recurrent Covid right now.  I think is viral gastro.  Will get CBC and CMP and lipase and hydrate patient and reassess.  6:03 PM Patient's creatinine is 2 with no baseline.  Patient CT showed fused ectopic kidney.  She also has possible colitis on CT.  White blood cell count is 11 and she is afebrile.  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  I do not have a baseline creatinine on her.  I think patient has viral gastroenteritis.  Since she is vomiting and has possible AKI I hesitate to give her Cipro and Flagyl and will hold on for now. I will discharge home with Zofran as needed.  If she runs fevers or has worsening pain then she can get repeat blood work and antibiotics.  She states that she will have PCP follow-up and get a repeat kidney function test   Final Clinical Impression(s) / ED Diagnoses Final diagnoses:  None    Rx / DC Orders ED Discharge Orders    None       Charlynne Pander, MD 10/16/20 1807

## 2020-10-16 NOTE — Discharge Instructions (Signed)
You likely have a stomach virus.  Take Zofran for nausea.  Stay hydrated.  Your creatinine is 2 right now.  Please have it rechecked with your doctor in several weeks  See your doctor for follow-up.  Return to ER if you have worse abdominal pain, vomiting, fevers, dehydration

## 2020-10-18 ENCOUNTER — Encounter (HOSPITAL_BASED_OUTPATIENT_CLINIC_OR_DEPARTMENT_OTHER): Payer: Self-pay

## 2020-10-18 ENCOUNTER — Other Ambulatory Visit (HOSPITAL_COMMUNITY): Payer: Self-pay | Admitting: Emergency Medicine

## 2020-10-18 ENCOUNTER — Emergency Department (HOSPITAL_BASED_OUTPATIENT_CLINIC_OR_DEPARTMENT_OTHER)
Admission: EM | Admit: 2020-10-18 | Discharge: 2020-10-18 | Disposition: A | Discharge status: Home / Self Care | Payer: BLUE CROSS/BLUE SHIELD | Attending: Emergency Medicine | Admitting: Emergency Medicine

## 2020-10-18 ENCOUNTER — Other Ambulatory Visit: Payer: Self-pay

## 2020-10-18 DIAGNOSIS — Z91040 Latex allergy status: Secondary | ICD-10-CM | POA: Diagnosis not present

## 2020-10-18 DIAGNOSIS — Z79899 Other long term (current) drug therapy: Secondary | ICD-10-CM | POA: Diagnosis not present

## 2020-10-18 DIAGNOSIS — I10 Essential (primary) hypertension: Secondary | ICD-10-CM | POA: Diagnosis not present

## 2020-10-18 DIAGNOSIS — B349 Viral infection, unspecified: Secondary | ICD-10-CM | POA: Diagnosis not present

## 2020-10-18 DIAGNOSIS — K529 Noninfective gastroenteritis and colitis, unspecified: Secondary | ICD-10-CM | POA: Insufficient documentation

## 2020-10-18 DIAGNOSIS — Z87891 Personal history of nicotine dependence: Secondary | ICD-10-CM | POA: Insufficient documentation

## 2020-10-18 DIAGNOSIS — R109 Unspecified abdominal pain: Secondary | ICD-10-CM | POA: Diagnosis present

## 2020-10-18 LAB — CBC WITH DIFFERENTIAL/PLATELET
Abs Immature Granulocytes: 0.71 10*3/uL — ABNORMAL HIGH (ref 0.00–0.07)
Basophils Absolute: 0 10*3/uL (ref 0.0–0.1)
Basophils Relative: 0 %
Eosinophils Absolute: 0.2 10*3/uL (ref 0.0–0.5)
Eosinophils Relative: 2 %
HCT: 34.1 % — ABNORMAL LOW (ref 36.0–46.0)
Hemoglobin: 11.6 g/dL — ABNORMAL LOW (ref 12.0–15.0)
Immature Granulocytes: 6 %
Lymphocytes Relative: 20 %
Lymphs Abs: 2.4 10*3/uL (ref 0.7–4.0)
MCH: 29.7 pg (ref 26.0–34.0)
MCHC: 34 g/dL (ref 30.0–36.0)
MCV: 87.4 fL (ref 80.0–100.0)
Monocytes Absolute: 1.6 10*3/uL — ABNORMAL HIGH (ref 0.1–1.0)
Monocytes Relative: 13 %
Neutro Abs: 7 10*3/uL (ref 1.7–7.7)
Neutrophils Relative %: 59 %
Platelets: 268 10*3/uL (ref 150–400)
RBC: 3.9 MIL/uL (ref 3.87–5.11)
RDW: 13 % (ref 11.5–15.5)
Smear Review: NORMAL
WBC: 11.9 10*3/uL — ABNORMAL HIGH (ref 4.0–10.5)
nRBC: 0 % (ref 0.0–0.2)

## 2020-10-18 LAB — URINALYSIS, ROUTINE W REFLEX MICROSCOPIC
Glucose, UA: NEGATIVE mg/dL
Hgb urine dipstick: NEGATIVE
Ketones, ur: 80 mg/dL — AB
Leukocytes,Ua: NEGATIVE
Nitrite: NEGATIVE
Protein, ur: 30 mg/dL — AB
Specific Gravity, Urine: 1.015 (ref 1.005–1.030)
pH: 6.5 (ref 5.0–8.0)

## 2020-10-18 LAB — URINALYSIS, MICROSCOPIC (REFLEX)

## 2020-10-18 LAB — COMPREHENSIVE METABOLIC PANEL
ALT: 25 U/L (ref 0–44)
AST: 18 U/L (ref 15–41)
Albumin: 3 g/dL — ABNORMAL LOW (ref 3.5–5.0)
Alkaline Phosphatase: 54 U/L (ref 38–126)
Anion gap: 12 (ref 5–15)
BUN: 20 mg/dL (ref 6–20)
CO2: 25 mmol/L (ref 22–32)
Calcium: 9 mg/dL (ref 8.9–10.3)
Chloride: 99 mmol/L (ref 98–111)
Creatinine, Ser: 0.89 mg/dL (ref 0.44–1.00)
GFR, Estimated: >60 mL/min (ref >60)
Glucose, Bld: 93 mg/dL (ref 70–99)
Potassium: 3.5 mmol/L (ref 3.5–5.1)
Sodium: 136 mmol/L (ref 135–145)
Total Bilirubin: 0.5 mg/dL (ref 0.3–1.2)
Total Protein: 6.6 g/dL (ref 6.5–8.1)

## 2020-10-18 LAB — LIPASE, BLOOD: Lipase: 15 U/L (ref 11–51)

## 2020-10-18 MED ORDER — PANTOPRAZOLE SODIUM 40 MG IV SOLR
40.0000 mg | Freq: Once | INTRAVENOUS | Status: AC
  Administered 2020-10-18: 40 mg via INTRAVENOUS
  Filled 2020-10-18: qty 40

## 2020-10-18 MED ORDER — FENTANYL CITRATE (PF) 100 MCG/2ML IJ SOLN
50.0000 ug | Freq: Once | INTRAMUSCULAR | Status: AC
Start: 2020-10-18 — End: 2020-10-18
  Administered 2020-10-18: 50 ug via INTRAVENOUS
  Filled 2020-10-18: qty 2

## 2020-10-18 MED ORDER — ALUM & MAG HYDROXIDE-SIMETH 200-200-20 MG/5ML PO SUSP
30.0000 mL | Freq: Once | ORAL | Status: AC
  Administered 2020-10-18: 30 mL via ORAL
  Filled 2020-10-18: qty 30

## 2020-10-18 MED ORDER — OXYCODONE-ACETAMINOPHEN 5-325 MG PO TABS: 2.0000 | ORAL_TABLET | ORAL | 0 refills | Status: AC | PRN

## 2020-10-18 MED ORDER — HYDROMORPHONE HCL 1 MG/ML IJ SOLN
1.0000 mg | Freq: Once | INTRAMUSCULAR | Status: AC
  Administered 2020-10-18: 1 mg via INTRAVENOUS
  Filled 2020-10-18: qty 1

## 2020-10-18 MED ORDER — SODIUM CHLORIDE 0.9 % IV BOLUS
1000.0000 mL | Freq: Once | INTRAVENOUS | Status: AC
  Administered 2020-10-18: 1000 mL via INTRAVENOUS

## 2020-10-18 MED ORDER — PANTOPRAZOLE SODIUM 40 MG PO TBEC: 40.0000 mg | DELAYED_RELEASE_TABLET | Freq: Every day | ORAL | 0 refills | Status: DC

## 2020-10-18 MED ORDER — ONDANSETRON HCL 4 MG/2ML IJ SOLN
4.0000 mg | Freq: Once | INTRAMUSCULAR | Status: AC
  Administered 2020-10-18: 4 mg via INTRAVENOUS
  Filled 2020-10-18: qty 2

## 2020-10-18 MED ORDER — CIPROFLOXACIN HCL 500 MG PO TABS: 500.0000 mg | ORAL_TABLET | Freq: Two times a day (BID) | ORAL | 0 refills | Status: DC

## 2020-10-18 MED ORDER — LIDOCAINE VISCOUS HCL 2 % MT SOLN
15.0000 mL | Freq: Once | OROMUCOSAL | Status: AC
  Administered 2020-10-18: 15 mL via ORAL
  Filled 2020-10-18: qty 15

## 2020-10-18 MED ORDER — METRONIDAZOLE 500 MG PO TABS: 500.0000 mg | ORAL_TABLET | Freq: Two times a day (BID) | ORAL | 0 refills | Status: DC

## 2020-10-18 MED FILL — PANTOPRAZOLE SOD DR 40 MG T: 40 | 30 days supply | Qty: 30 | Fill #0

## 2020-10-18 MED FILL — CIPROFLOXACIN HCL 500 MG TA: 500 | 7 days supply | Qty: 14 | Fill #0

## 2020-10-18 MED FILL — METRONIDAZOLE 500 MG TABS: 500 | 7 days supply | Qty: 14 | Fill #0

## 2020-10-18 MED FILL — OXYCODONE-ACETAMINOPHEN 5-3: 5-325 | 1 days supply | Qty: 8 | Fill #0

## 2020-10-18 NOTE — Discharge Instructions (Addendum)
Please take antibiotic as prescribed.  Can use pain medicine as prescribed as well.  Would additionally recommend using the acid suppressant for at least the next couple weeks or until further directed by your primary doctor.  Please schedule an appointment with your primary care doctor for close recheck.  If you develop worsening abdominal pain, fever, vomiting or other new concerning symptoms, return to ER.  Because you had the episodes of blood in your stool and vomit, you should see a gastroenterologist.

## 2020-10-18 NOTE — ED Provider Notes (Signed)
[facility] EMERGENCY DEPARTMENT Provider Note   CSN: [id number] Arrival date & time: 10/18/20  1121     History Chief Complaint  Patient presents with  . Emesis    Wendy Cooper is a 50 y.o. female.  Presents to the emergency room with concern for abdominal pain nausea and vomiting.  Patient reports that she has been sick for the last 5 days or so.  States that she initially had both loose stools and vomiting.  Had streaks of blood in vomit and her stool was dark.  Came to the ER, CT scan showed likely infectious colitis and blood work showed AKI.  She states that she was feeling better and went home.  Then symptoms started to worsen and have been relatively persistent since.  Notably she has not had any recurrent episodes of vomiting and states that nausea is minimal at present.  States that her primary concern is pain, has significant epigastric pain as soon as food hits her stomach.  No no blood in stool, is passing gas.  No fever or cough.  Has been able to keep down liquids and solids albeit with some pain. HPI     Past Medical History:  Diagnosis Date  . Anxiety   . Hypertension   . Insomnia   . PTSD (post-traumatic stress disorder)   . Renal disorder     Patient Active Problem List   Diagnosis Date Noted  . Essential hypertension 12/20/2019    Past Surgical History:  Procedure Laterality Date  . BACK SURGERY    . HIP SURGERY Right   . TUBAL LIGATION       OB History   No obstetric history on file.     Family History  Problem Relation Age of Onset  . Cancer Mother   . Alzheimer's disease Father     Social History   Tobacco Use  . Smoking status: Former Smoker    Packs/day: 0.50    Types: Cigarettes    Quit date: 10/13/2020    Years since quitting: 0.0  . Smokeless tobacco: Never Used  Vaping Use  . Vaping Use: Never used  Substance Use Topics  . Alcohol use: Not Currently    Alcohol/week: 1.0 standard drink    Types: 1 Cans of beer per  week  . Drug use: Not Currently    Home Medications Prior to Admission medications   Medication Sig Start Date End Date Taking? Authorizing Provider  ciprofloxacin (CIPRO) 500 MG tablet Take 1 tablet (500 mg total) by mouth every 12 (twelve) hours for 7 days. 10/18/20 10/25/20 Yes Rethel Sebek, Quitman Livings, MD  metroNIDAZOLE (FLAGYL) 500 MG tablet Take 1 tablet (500 mg total) by mouth 2 (two) times daily for 7 days. 10/18/20 10/25/20 Yes Devinn Hurwitz, Quitman Livings, MD  oxyCODONE-acetaminophen (PERCOCET/ROXICET) 5-325 MG tablet Take 2 tablets by mouth every 4 (four) hours as needed for up to 3 days for severe pain. 10/18/20 10/21/20 Yes Bailey Faiella, Quitman Livings, MD  pantoprazole (PROTONIX) 40 MG tablet Take 1 tablet (40 mg total) by mouth daily. 10/18/20 11/17/20 Yes Quanika Solem, Quitman Livings, MD  alprazolam Prudy Feeler) 2 MG tablet Take by mouth. 03/08/20   [provider]  DULoxetine (CYMBALTA) 60 MG capsule Take by mouth. 02/18/20   [provider]  fluticasone (FLONASE) 50 MCG/ACT nasal spray Place 2 sprays into both nostrils daily. 12/20/19   Saguier, Ramon Dredge, PA-C  HYDROcodone-acetaminophen (NORCO/VICODIN) 5-325 MG tablet Take 1 tablet by mouth every 4 (four) hours as needed. 07/30/20  Sabas Sous, MD  lidocaine (LIDODERM) 5 % Place 1 patch onto the skin daily. Remove & Discard patch within 12 hours or as directed by MD 08/16/20   Couture, Cortni S, PA-C  lisinopril-hydrochlorothiazide (ZESTORETIC) 20-12.5 MG tablet Take 1 tablet by mouth daily. 02/18/20   [provider]  mirtazapine (REMERON) 15 MG tablet Take by mouth. 03/11/20   [provider]  naproxen (NAPROSYN) 500 MG tablet Take 1 tablet (500 mg total) by mouth 2 (two) times daily. 07/30/20   Sabas Sous, MD  ondansetron (ZOFRAN ODT) 4 MG disintegrating tablet 4mg  ODT q4 hours prn nausea/vomit 10/16/20   12/14/20, MD  oxyCODONE-acetaminophen (PERCOCET) 10-325 MG tablet Take 1 tablet by mouth every 8 (eight) hours as needed.  02/26/20   [provider]  predniSONE (STERAPRED UNI-PAK 21 TAB) 10 MG (21) TBPK tablet Take by mouth daily. Take 6 tabs by mouth daily  for 2 days, then 5 tabs for 2 days, then 4 tabs for 2 days, then 3 tabs for 2 days, 2 tabs for 2 days, then 1 tab by mouth daily for 2 days 08/16/20   Couture, Cortni S, PA-C  traZODone (DESYREL) 50 MG tablet TAKE 1 TO 2 TABLETS AT BEDTIME AS NEEDED FOR INSOMNIA 02/06/20   Saguier, 04/07/20, PA-C    Allergies    Latex and Sulfa antibiotics  Review of Systems   Review of Systems  Constitutional: Negative for chills and fever.  HENT: Negative for ear pain and sore throat.   Eyes: Negative for pain and visual disturbance.  Respiratory: Negative for cough and shortness of breath.   Cardiovascular: Negative for chest pain and palpitations.  Gastrointestinal: Positive for abdominal pain, nausea and vomiting.  Genitourinary: Negative for dysuria and hematuria.  Musculoskeletal: Negative for arthralgias and back pain.  Skin: Negative for color change and rash.  Neurological: Negative for seizures and syncope.  All other systems reviewed and are negative.   Physical Exam Updated Vital Signs BP 130/76 (BP Location: Left Arm)   Pulse 89   Temp (!) 97.1 F (36.2 C) (Oral)   Resp 18   Ht 5\' 8"  (1.727 m)   Wt 114.3 kg   SpO2 100%   BMI 38.30 kg/m   Physical Exam Vitals and nursing note reviewed.  Constitutional:      General: She is not in acute distress.    Appearance: She is well-developed and well-nourished.  HENT:     Head: Normocephalic and atraumatic.  Eyes:     Conjunctiva/sclera: Conjunctivae normal.  Cardiovascular:     Rate and Rhythm: Normal rate and regular rhythm.     Heart sounds: No murmur heard.   Pulmonary:     Effort: Pulmonary effort is normal. No respiratory distress.     Breath sounds: Normal breath sounds.  Abdominal:     Palpations: Abdomen is soft.     Comments: Some tenderness in the epigastrium but no rebound  or guarding, no other tenderness noted, negative Murphy's  Musculoskeletal:        General: No edema.     Cervical back: Neck supple.  Skin:    General: Skin is warm and dry.  Neurological:     General: No focal deficit present.     Mental Status: She is alert.  Psychiatric:        Mood and Affect: Mood and affect and mood normal.        Behavior: Behavior normal.     ED  Results / Procedures / Treatments   Labs (all labs ordered are listed, but only abnormal results are displayed) Labs Reviewed  CBC WITH DIFFERENTIAL/PLATELET - Abnormal; Notable for the following components:      Result Value   WBC 11.9 (*)    Hemoglobin 11.6 (*)    HCT 34.1 (*)    Monocytes Absolute 1.6 (*)    Abs Immature Granulocytes 0.71 (*)    All other components within normal limits  COMPREHENSIVE METABOLIC PANEL - Abnormal; Notable for the following components:   Albumin 3.0 (*)    All other components within normal limits  LIPASE, BLOOD  URINALYSIS, ROUTINE W REFLEX MICROSCOPIC    EKG None  Radiology CT Renal Stone Study  Result Date: 10/16/2020 CLINICAL DATA:  Flank pain.  Vomiting.  Duration of symptoms 2 days. EXAM: CT ABDOMEN AND PELVIS WITHOUT CONTRAST TECHNIQUE: Multidetector CT imaging of the abdomen and pelvis was performed following the standard protocol without IV contrast. COMPARISON:  None. FINDINGS: Lower chest: Hiatal hernia. Mild linear scarring or atelectasis at both lung bases. Hepatobiliary: Normal Pancreas: Normal Spleen: Normal Adrenals/Urinary Tract: Adrenal glands are normal. Right kidney is normal. No cyst, mass, stone or hydronephrosis. 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 No sign of obstruction. Bladder is normal. Stomach/Bowel: No evidence of bowel obstruction. There is wall thickening of the colon beginning at about the splenic flexure, consistent with infectious colitis. Mild adjacent fat stranding. Diverticulosis  of the left colon but without evidence of diverticulitis. Vascular/Lymphatic: Aortic atherosclerosis. No aneurysm. IVC is normal. No retroperitoneal adenopathy. Reproductive: Normal Other: No free fluid or air. Musculoskeletal: Previous L5-S1 fusion. Degenerative changes at L3-4 and L4-5. IMPRESSION: 1. 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 No sign of obstruction. 2. Wall thickening of the colon with adjacent fat edema from the splenic flexure through the left colon, most consistent with infectious colitis. 3. Hiatal hernia. 4. Aortic atherosclerosis. 5. Diverticulosis of the left colon without evidence of diverticulitis. 6. Previous L5-S1 fusion. Degenerative changes at L3-4 and L4-5. Aortic Atherosclerosis (ICD10-I70.0). Electronically Signed   By: Paulina Fusi M.D.   On: 10/16/2020 17:40    Procedures Procedures   Medications Ordered in ED Medications  sodium chloride 0.9 % bolus 1,000 mL (0 mLs Intravenous Stopped 10/18/20 1355)  ondansetron (ZOFRAN) injection 4 mg (4 mg Intravenous Given 10/18/20 1231)  fentaNYL (SUBLIMAZE) injection 50 mcg (50 mcg Intravenous Given 10/18/20 1233)  pantoprazole (PROTONIX) injection 40 mg (40 mg Intravenous Given 10/18/20 1238)  HYDROmorphone (DILAUDID) injection 1 mg (1 mg Intravenous Given 10/18/20 1355)  alum & mag hydroxide-simeth (MAALOX/MYLANTA) 200-200-20 MG/5ML suspension 30 mL (30 mLs Oral Given 10/18/20 1351)    And  lidocaine (XYLOCAINE) 2 % viscous mouth solution 15 mL (15 mLs Oral Given 10/18/20 1351)    ED Course  I have reviewed the triage vital signs and the nursing notes.  Pertinent labs & imaging results that were available during my care of the patient were reviewed by me and considered in my medical decision making (see chart for details).    MDM Rules/Calculators/A&P                         50 year old lady presents to ER with abdominal pain.  Recently diagnosed with viral  gastroenteritis, AKI.  On exam patient well-appearing with stable vital signs, abdomen was soft,  did have some tenderness to palpation in the epigastrium.  Repeat labs show stable mild leukocytosis, creatinine markedly improved.  LFTs within normal limits.  Given these findings and relatively benign exam, do not feel she requires repeat CT imaging today.  Suspect symptoms are related to colitis versus gastroenteritis.  Given persistent symptoms, will start on course of antibiotics.  Additionally given the report of blood in vomit a few days ago recommend course of PPI.  On reassessment her abdominal exam remains reassuring and her symptoms have resolved and she is tolerating p.o.  Believe she is appropriate for outpatient management.  Recommend follow-up with primary doctor and gastroenterology.  After the discussed management above, the patient was determined to be safe for discharge.  The patient was in agreement with this plan and all questions regarding their care were answered.  ED return precautions were discussed and the patient will return to the ED with any significant worsening of condition.    Final Clinical Impression(s) / ED Diagnoses Final diagnoses:  Colitis  Viral syndrome    Rx / DC Orders ED Discharge Orders         Ordered    ciprofloxacin (CIPRO) 500 MG tablet  Every 12 hours        10/18/20 1458    metroNIDAZOLE (FLAGYL) 500 MG tablet  2 times daily        10/18/20 1458    pantoprazole (PROTONIX) 40 MG tablet  Daily        10/18/20 1502    oxyCODONE-acetaminophen (PERCOCET/ROXICET) 5-325 MG tablet  Every 4 hours PRN        10/18/20 1513           Milagros Loll, MD 10/18/20 1528

## 2020-10-18 NOTE — ED Triage Notes (Signed)
Pt arrives ambulatory with reports of vomiting blood and having black stools. Pt states she has not been able to eat since she left, pt arrives with cup of water reports she is having pain when eating states that she is having epigastric pain. Also reports she has not had a BM since Monday and been fatigued.

## 2020-10-23 ENCOUNTER — Emergency Department (HOSPITAL_BASED_OUTPATIENT_CLINIC_OR_DEPARTMENT_OTHER)
Admission: EM | Admit: 2020-10-23 | Discharge: 2020-10-23 | Disposition: A | Discharge status: Home / Self Care | Payer: BLUE CROSS/BLUE SHIELD | Attending: Emergency Medicine | Admitting: Emergency Medicine

## 2020-10-23 ENCOUNTER — Other Ambulatory Visit (HOSPITAL_COMMUNITY): Payer: Self-pay | Admitting: Emergency Medicine

## 2020-10-23 ENCOUNTER — Emergency Department (HOSPITAL_BASED_OUTPATIENT_CLINIC_OR_DEPARTMENT_OTHER): Payer: BLUE CROSS/BLUE SHIELD

## 2020-10-23 ENCOUNTER — Other Ambulatory Visit: Payer: Self-pay

## 2020-10-23 ENCOUNTER — Encounter (HOSPITAL_BASED_OUTPATIENT_CLINIC_OR_DEPARTMENT_OTHER): Payer: Self-pay | Admitting: Radiology

## 2020-10-23 DIAGNOSIS — R059 Cough, unspecified: Secondary | ICD-10-CM | POA: Diagnosis not present

## 2020-10-23 DIAGNOSIS — R1011 Right upper quadrant pain: Secondary | ICD-10-CM

## 2020-10-23 DIAGNOSIS — Z91040 Latex allergy status: Secondary | ICD-10-CM | POA: Diagnosis not present

## 2020-10-23 DIAGNOSIS — Z79899 Other long term (current) drug therapy: Secondary | ICD-10-CM | POA: Diagnosis not present

## 2020-10-23 DIAGNOSIS — I10 Essential (primary) hypertension: Secondary | ICD-10-CM | POA: Diagnosis not present

## 2020-10-23 DIAGNOSIS — R799 Abnormal finding of blood chemistry, unspecified: Secondary | ICD-10-CM | POA: Diagnosis not present

## 2020-10-23 DIAGNOSIS — Z87891 Personal history of nicotine dependence: Secondary | ICD-10-CM | POA: Insufficient documentation

## 2020-10-23 DIAGNOSIS — K529 Noninfective gastroenteritis and colitis, unspecified: Secondary | ICD-10-CM | POA: Diagnosis not present

## 2020-10-23 DIAGNOSIS — R1013 Epigastric pain: Secondary | ICD-10-CM | POA: Diagnosis present

## 2020-10-23 LAB — COMPREHENSIVE METABOLIC PANEL
ALT: 37 U/L (ref 0–44)
AST: 32 U/L (ref 15–41)
Albumin: 3.4 g/dL — ABNORMAL LOW (ref 3.5–5.0)
Alkaline Phosphatase: 46 U/L (ref 38–126)
Anion gap: 9 (ref 5–15)
BUN: 9 mg/dL (ref 6–20)
CO2: 26 mmol/L (ref 22–32)
Calcium: 9.3 mg/dL (ref 8.9–10.3)
Chloride: 104 mmol/L (ref 98–111)
Creatinine, Ser: 0.95 mg/dL (ref 0.44–1.00)
GFR, Estimated: >60 mL/min (ref >60)
Glucose, Bld: 122 mg/dL — ABNORMAL HIGH (ref 70–99)
Potassium: 4.4 mmol/L (ref 3.5–5.1)
Sodium: 139 mmol/L (ref 135–145)
Total Bilirubin: 0.3 mg/dL (ref 0.3–1.2)
Total Protein: 6.8 g/dL (ref 6.5–8.1)

## 2020-10-23 LAB — URINALYSIS, ROUTINE W REFLEX MICROSCOPIC
Bilirubin Urine: NEGATIVE
Glucose, UA: NEGATIVE mg/dL
Hgb urine dipstick: NEGATIVE
Ketones, ur: NEGATIVE mg/dL
Leukocytes,Ua: NEGATIVE
Nitrite: NEGATIVE
Protein, ur: NEGATIVE mg/dL
Specific Gravity, Urine: 1.025 (ref 1.005–1.030)
pH: 6.5 (ref 5.0–8.0)

## 2020-10-23 LAB — CBC
HCT: 37.7 % (ref 36.0–46.0)
Hemoglobin: 12.4 g/dL (ref 12.0–15.0)
MCH: 29.6 pg (ref 26.0–34.0)
MCHC: 32.9 g/dL (ref 30.0–36.0)
MCV: 90 fL (ref 80.0–100.0)
Platelets: 363 10*3/uL (ref 150–400)
RBC: 4.19 MIL/uL (ref 3.87–5.11)
RDW: 13.9 % (ref 11.5–15.5)
WBC: 20.2 10*3/uL — ABNORMAL HIGH (ref 4.0–10.5)
nRBC: 0 % (ref 0.0–0.2)

## 2020-10-23 LAB — LIPASE, BLOOD: Lipase: 26 U/L (ref 11–51)

## 2020-10-23 LAB — LACTIC ACID, PLASMA: Lactic Acid, Venous: 0.9 mmol/L (ref 0.5–1.9)

## 2020-10-23 MED ORDER — METRONIDAZOLE 500 MG PO TABS: 500.0000 mg | ORAL_TABLET | Freq: Two times a day (BID) | ORAL | 0 refills | Status: AC

## 2020-10-23 MED ORDER — SODIUM CHLORIDE 0.9 % IV BOLUS
1000.0000 mL | Freq: Once | INTRAVENOUS | Status: AC
  Administered 2020-10-23: 1000 mL via INTRAVENOUS

## 2020-10-23 MED ORDER — MORPHINE SULFATE (PF) 4 MG/ML IV SOLN
4.0000 mg | Freq: Once | INTRAVENOUS | Status: AC
  Administered 2020-10-23: 4 mg via INTRAVENOUS
  Filled 2020-10-23: qty 1

## 2020-10-23 MED ORDER — ONDANSETRON HCL 4 MG/2ML IJ SOLN
4.0000 mg | Freq: Once | INTRAMUSCULAR | Status: AC
  Administered 2020-10-23: 4 mg via INTRAVENOUS
  Filled 2020-10-23: qty 2

## 2020-10-23 MED ORDER — CIPROFLOXACIN HCL 500 MG PO TABS: 500.0000 mg | ORAL_TABLET | Freq: Two times a day (BID) | ORAL | 0 refills | Status: AC

## 2020-10-23 MED ORDER — OXYCODONE-ACETAMINOPHEN 5-325 MG PO TABS: 1.0000 | ORAL_TABLET | Freq: Four times a day (QID) | ORAL | 0 refills | Status: DC | PRN

## 2020-10-23 MED ORDER — IOHEXOL 300 MG/ML  SOLN
100.0000 mL | Freq: Once | INTRAMUSCULAR | Status: AC | PRN
  Administered 2020-10-23: 100 mL via INTRAVENOUS

## 2020-10-23 MED FILL — OXYCODONE-ACETAMINOPHEN 5-3: 5-325 | 2 days supply | Qty: 8 | Fill #0

## 2020-10-23 MED FILL — METRONIDAZOLE 500 MG TABS: 500 | 7 days supply | Qty: 14 | Fill #0

## 2020-10-23 MED FILL — CIPROFLOXACIN HCL 500 MG TA: 500 | 7 days supply | Qty: 14 | Fill #0

## 2020-10-23 NOTE — ED Triage Notes (Signed)
Pt arrives stating her PCP sent her here for elevated WBC from labs drawn yesterday. Has been on abx for colitis. C/o abdominal pain and black stools.

## 2020-10-23 NOTE — ED Provider Notes (Signed)
[facility] EMERGENCY DEPARTMENT Provider Note   CSN: [id number] Arrival date & time: 10/23/20  [id number]     History Chief Complaint  Patient presents with  . Abnormal Lab    Wendy Cooper is a 50 y.o. female.  She is here for evaluation of continuing to feel sick for about a week.  She was seen about a week ago for nausea and vomiting.  Had an AKI.  Returned 5 days ago and had a second CT that showed probable infectious colitis and was put on antibiotics.  She followed up with her primary care doctor yesterday who drew some labs and told her to return to the emergency department because her white count was elevated.  She has not had any vomiting or diarrhea but she still feels nauseous and has epigastric abdominal pain.  Still feeling fatigued.  No known fevers.  Tolerating liquids.  The history is provided by the patient.  Abnormal Lab Time since result:  1 day Patient referred by:  PCP Resulting agency:  Internal Result type: hematology   Hematology:    Leukocytes:  High Abdominal Pain Pain location:  Epigastric Pain quality: aching   Pain radiates to:  Does not radiate Pain severity:  Moderate Onset quality:  Gradual Duration:  1 week Timing:  Intermittent Progression:  Improving Context: not trauma   Relieved by:  Nothing Worsened by:  Nothing Ineffective treatments:  OTC medications Associated symptoms: cough, fatigue and nausea   Associated symptoms: no chest pain, no constipation, no dysuria, no fever, no shortness of breath and no sore throat        Past Medical History:  Diagnosis Date  . Anxiety   . Hypertension   . Insomnia   . PTSD (post-traumatic stress disorder)   . Renal disorder     Patient Active Problem List   Diagnosis Date Noted  . Essential hypertension 12/20/2019    Past Surgical History:  Procedure Laterality Date  . BACK SURGERY    . HIP SURGERY Right   . TUBAL LIGATION       OB History   No obstetric history on file.      Family History  Problem Relation Age of Onset  . Cancer Mother   . Alzheimer's disease Father     Social History   Tobacco Use  . Smoking status: Former Smoker    Packs/day: 0.50    Types: Cigarettes    Quit date: 10/13/2020    Years since quitting: 0.0  . Smokeless tobacco: Never Used  Vaping Use  . Vaping Use: Never used  Substance Use Topics  . Alcohol use: Not Currently    Alcohol/week: 1.0 standard drink    Types: 1 Cans of beer per week  . Drug use: Not Currently    Home Medications Prior to Admission medications   Medication Sig Start Date End Date Taking? Authorizing Provider  alprazolam Prudy Feeler(XANAX) 2 MG tablet Take by mouth. 03/08/20  Yes [provider]  bisoprolol-hydrochlorothiazide (ZIAC) 10-6.25 MG tablet Take 1 tablet by mouth at bedtime. 08/27/20  Yes [provider]  ciprofloxacin (CIPRO) 500 MG tablet Take 1 tablet (500 mg total) by mouth every 12 (twelve) hours for 7 days. 10/18/20 10/25/20 Yes Milagros Lollykstra, Richard S, MD  DULoxetine (CYMBALTA) 60 MG capsule Take by mouth. 02/18/20  Yes [provider]  lisinopril-hydrochlorothiazide (ZESTORETIC) 20-12.5 MG tablet Take 1 tablet by mouth daily. 02/18/20  Yes [provider]  metroNIDAZOLE (FLAGYL) 500 MG tablet Take 1  tablet (500 mg total) by mouth 2 (two) times daily for 7 days. 10/18/20 10/25/20 Yes Milagros Loll, MD  mirtazapine (REMERON) 15 MG tablet Take by mouth. 03/11/20  Yes [provider]  oxyCODONE-acetaminophen (PERCOCET) 10-325 MG tablet Take 1 tablet by mouth every 8 (eight) hours as needed. 02/26/20  Yes [provider]  pantoprazole (PROTONIX) 40 MG tablet Take 1 tablet (40 mg total) by mouth daily. 10/18/20 11/17/20 Yes Dykstra, Quitman Livings, MD  fluticasone (FLONASE) 50 MCG/ACT nasal spray Place 2 sprays into both nostrils daily. 12/20/19   Saguier, Ramon Dredge, PA-C  HYDROcodone-acetaminophen (NORCO/VICODIN) 5-325 MG tablet Take 1 tablet by mouth every 4 (four)  hours as needed. 07/30/20   Sabas Sous, MD  lidocaine (LIDODERM) 5 % Place 1 patch onto the skin daily. Remove & Discard patch within 12 hours or as directed by MD 08/16/20   Couture, Cortni S, PA-C  naproxen (NAPROSYN) 500 MG tablet Take 1 tablet (500 mg total) by mouth 2 (two) times daily. 07/30/20   Sabas Sous, MD  ondansetron (ZOFRAN ODT) 4 MG disintegrating tablet 4mg  ODT q4 hours prn nausea/vomit 10/16/20   12/14/20, MD  predniSONE (STERAPRED UNI-PAK 21 TAB) 10 MG (21) TBPK tablet Take by mouth daily. Take 6 tabs by mouth daily  for 2 days, then 5 tabs for 2 days, then 4 tabs for 2 days, then 3 tabs for 2 days, 2 tabs for 2 days, then 1 tab by mouth daily for 2 days 08/16/20   Couture, Cortni S, PA-C  traZODone (DESYREL) 50 MG tablet TAKE 1 TO 2 TABLETS AT BEDTIME AS NEEDED FOR INSOMNIA 02/06/20   Saguier, 04/07/20, PA-C    Allergies    Latex and Sulfa antibiotics  Review of Systems   Review of Systems  Constitutional: Positive for fatigue. Negative for fever.  HENT: Negative for sore throat.   Eyes: Negative for visual disturbance.  Respiratory: Positive for cough. Negative for shortness of breath.   Cardiovascular: Negative for chest pain.  Gastrointestinal: Positive for abdominal pain and nausea. Negative for constipation.  Genitourinary: Negative for dysuria.  Musculoskeletal: Negative for neck pain.  Skin: Negative for rash.  Neurological: Negative for headaches.    Physical Exam Updated Vital Signs BP (!) 138/108 (BP Location: Right Arm)   Pulse 73   Temp 98 F (36.7 C) (Oral)   Resp 17   Ht 5\' 8"  (1.727 m)   Wt 111.1 kg   SpO2 100%   BMI 37.25 kg/m   Physical Exam Vitals and nursing note reviewed.  Constitutional:      General: She is not in acute distress.    Appearance: Normal appearance. She is well-developed and well-nourished.  HENT:     Head: Normocephalic and atraumatic.  Eyes:     Conjunctiva/sclera: Conjunctivae normal.   Cardiovascular:     Rate and Rhythm: Normal rate and regular rhythm.     Heart sounds: No murmur heard.   Pulmonary:     Effort: Pulmonary effort is normal. No respiratory distress.     Breath sounds: Normal breath sounds.  Abdominal:     Palpations: Abdomen is soft.     Tenderness: There is no abdominal tenderness. There is no guarding or rebound.  Musculoskeletal:        General: No deformity, signs of injury or edema. Normal range of motion.     Cervical back: Neck supple.  Skin:    General: Skin is warm and dry.  Neurological:  General: No focal deficit present.     Mental Status: She is alert.  Psychiatric:        Mood and Affect: Mood and affect normal.     ED Results / Procedures / Treatments   Labs (all labs ordered are listed, but only abnormal results are displayed) Labs Reviewed  COMPREHENSIVE METABOLIC PANEL - Abnormal; Notable for the following components:      Result Value   Glucose, Bld 122 (*)    Albumin 3.4 (*)    All other components within normal limits  CBC - Abnormal; Notable for the following components:   WBC 20.2 (*)    All other components within normal limits  CULTURE, BLOOD (ROUTINE X 2)  CULTURE, BLOOD (ROUTINE X 2)  LIPASE, BLOOD  URINALYSIS, ROUTINE W REFLEX MICROSCOPIC  LACTIC ACID, PLASMA    EKG None  Radiology CT Abdomen Pelvis W Contrast  Result Date: 10/23/2020 CLINICAL DATA:  Abdominal pain and black stool EXAM: CT ABDOMEN AND PELVIS WITH CONTRAST TECHNIQUE: Multidetector CT imaging of the abdomen and pelvis was performed using the standard protocol following bolus administration of intravenous contrast. Oral contrast also administered. CONTRAST:  OMNIPAQUE IOHEXOL 300 MG/ML  SOLN COMPARISON:  October 16, 2020 CT abdomen and pelvis FINDINGS: Lower chest: There is bibasilar atelectasis. There is a focal hiatal hernia. There are occasional foci of coronary artery calcification. Hepatobiliary: There is fatty infiltration  near the fissure for the ligamentum teres. No focal liver lesions evident elsewhere. Gallbladder wall appears mildly thickened with suspected sludge in the gallbladder. There is no biliary duct dilatation. Pancreas: No pancreatic mass or inflammatory focus. Spleen: No splenic lesions are evident. Adrenals/Urinary Tract: Adrenals bilaterally appear unremarkable. 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 The left kidney is to the right of midline with the superior aspect of the right kidney and normal flank position. This finding is stable compared to recent prior study. There is no evident renal mass or hydronephrosis on either side. Note that there is a large extrarenal pelvis on the left, stable. No appreciable renal or ureteral calculus evident. Urinary bladder is midline with wall thickness within normal limits. Stomach/Bowel: There are multiple sigmoid diverticula. There is again noted a degree of wall thickening in the mid to distal sigmoid colon. Fluid tracks from the distal descending colon on the right into the posterior dependent portion the pelvis with focal ascites in this area. No abscess or perforation evident. Elsewhere, there is no evident bowel wall or mesenteric thickening. No appreciable bowel obstruction. There is no free air or portal venous air. Terminal ileum appears normal. Vascular/Lymphatic: No abdominal aortic aneurysm. Scattered foci of aortic atherosclerosis noted. Major venous structures appear patent. There is no evident adenopathy in the abdomen or pelvis. Reproductive: Uterus anteverted.  No adnexal masses. Other: Appendix appears normal. No abscess evident in the abdomen pelvis. There is no ascites beyond the fluid in the dependent portion of the pelvis posteriorly. There is thinning of the rectus muscle in the midline. Fat extends into this area of muscle thinning. No bowel extends into this area. Musculoskeletal: Postoperative change noted at  L5-S1. No blastic or lytic bone lesions. No intramuscular lesions are evident. IMPRESSION: 1. Findings felt to represent diverticulitis involving portions of the mid to distal sigmoid colon. There are multiple diverticula in this area with mild wall thickening. There is now fluid tracking from the inferior aspect of the distal sigmoid colon into the bladder. There  may be a degree of underlying infectious colitis. There is no abscess or perforation in this area. 2. There is apparent thickening of the wall of the gallbladder with suspected sludge within the gallbladder. This finding may warrant correlation with ultrasound of the gallbladder to further evaluate. 3. 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 The superior aspect of the right kidney is in flank position. There is dilatation of extrarenal pelvis on the left, a stable finding without obstructing focus evident. No renal or ureteral calculi. Urinary bladder wall thickness normal. 4.  Focal hiatal hernia. 5. Aortic Atherosclerosis (ICD10-I70.0). Foci of coronary artery calcification also noted. 6. No bowel obstruction. No abscess in the abdomen or pelvis. Appendix appears normal. Electronically Signed   By: Bretta Bang III M.D.   On: 10/23/2020 13:18   US Abdomen Limited RUQ (LIVER/GB)  Result Date: 10/23/2020 CLINICAL DATA:  Pain EXAM: ULTRASOUND ABDOMEN LIMITED RIGHT UPPER QUADRANT COMPARISON:  CT from the same day FINDINGS: Gallbladder: There is gallbladder wall thickening with the gallbladder wall measuring up to approximately 5 mm in thickness. There is gallbladder sludge. No definite gallstones. The sonographic Eulah Pont sign is negative. There is no pericholecystic free fluid. Common bile duct: Diameter: 3 mm Liver: There is an echogenic focus within the left hepatic lobe measuring approximately 1.5 cm. No other Paddock mass identified on today's study. This lesion was seen on the  patient's recent CT. Portal vein is patent on color Doppler imaging with normal direction of blood flow towards the liver. Other: None. IMPRESSION: 1. Gallbladder wall thickening without definite sonographic evidence for acute cholecystitis. If there is persistent clinical concern, follow-up with HIDA scan is recommended. 2. Echogenic 1.5 cm nodule in the left hepatic lobe. Statistically, this is most likely to represent a benign hepatic hemangioma. Electronically Signed   By: Katherine Mantle M.D.   On: 10/23/2020 14:06    Procedures Procedures   Medications Ordered in ED Medications  sodium chloride 0.9 % bolus 1,000 mL (0 mLs Intravenous Stopped 10/23/20 1206)  ondansetron (ZOFRAN) injection 4 mg (4 mg Intravenous Given 10/23/20 1055)  morphine 4 MG/ML injection 4 mg (4 mg Intravenous Given 10/23/20 1056)  iohexol (OMNIPAQUE) 300 MG/ML solution 100 mL (100 mLs Intravenous Contrast Given 10/23/20 1235)    ED Course  I have reviewed the triage vital signs and the nursing notes.  Pertinent labs & imaging results that were available during my care of the patient were reviewed by me and considered in my medical decision making (see chart for details).  Clinical Course as of 10/23/20 1931  Wed Oct 23, 2020  1421 Results with patient.  Other than the elevated white count everything seems fairly stable.  We will have her continue her antibiotics and will update her pain medicine.  Recommended close follow-up with her PCP. [MB]    Clinical Course User Index [MB] Terrilee Files, MD   MDM Rules/Calculators/A&P                         Jnaya Butrick was evaluated in Emergency Department on 10/23/2020 for the symptoms described in the history of present illness. She was evaluated in the context of the global COVID-19 pandemic, which necessitated consideration that the patient might be at risk for infection with the SARS-CoV-2 virus that causes COVID-19. Institutional protocols and algorithms that  pertain to the evaluation of patients at risk for  COVID-19 are in a state of rapid change based on information released by regulatory bodies including the CDC and federal and state organizations. These policies and algorithms were followed during the patient's care in the ED.  This patient complains of continued nausea, upper abdominal pain, elevated white count; this involves an extensive number of treatment Options and is a complaint that carries with it a high risk of complications and Morbidity. The differential includes colitis, diverticulitis, cholelithiasis, cholecystitis, abscess formation  I ordered, reviewed and interpreted labs, which included CBC with elevated white count, stable hemoglobin, chemistries LFTs normal, lactic acid normal, urinalysis without signs of infection, blood cultures drawn and pending at time of discharge I ordered medication IV fluids nausea and pain medicine with improvement in her symptoms I ordered imaging studies which included CT abdomen and pelvis and right upper quadrant ultrasound and I independently    visualized and interpreted imaging which showed elements of colitis and diverticulitis, gallbladder wall thickening but no stones or pericholecystic fluid Previous records obtained and reviewed in epic including prior ED visits  After the interventions stated above, I reevaluated the patient and found patient to be nontoxic-appearing and hemodynamically stable.  Reviewed results of imaging with her.  Will refill her antibiotic and pain medication and recommended close follow-up with PCP.  Return instructions discussed   Final Clinical Impression(s) / ED Diagnoses Final diagnoses:  RUQ abdominal pain  Colitis    Rx / DC Orders ED Discharge Orders         Ordered    ciprofloxacin (CIPRO) 500 MG tablet  Every 12 hours        10/23/20 1430    metroNIDAZOLE (FLAGYL) 500 MG tablet  2 times daily        10/23/20 1430    oxyCODONE-acetaminophen  (PERCOCET/ROXICET) 5-325 MG tablet  Every 6 hours PRN        10/23/20 1430           Terrilee Files, MD 10/23/20 1933

## 2020-10-23 NOTE — Progress Notes (Signed)
PT and bed out of room at this time- vitals not completed at this time.

## 2020-10-23 NOTE — Discharge Instructions (Signed)
You were seen in the emergency department for an elevated white count in the setting of recent treatment of colitis. Your CAT scan and does not show any significant worsening.  You also had an ultrasound of your gallbladder that showed some thickening but no obvious signs of cholecystitis.  Please follow-up with your regular doctor.  We are continuing on antibiotics.  Short course of pain medicine.  Return to the emergency department if any fever or worsening symptoms

## 2020-10-23 NOTE — ED Notes (Signed)
Pt on monitor and vitals cycling 

## 2020-10-23 NOTE — ED Notes (Signed)
ED Provider at bedside. 

## 2020-10-28 LAB — CULTURE, BLOOD (ROUTINE X 2)
Culture: NO GROWTH
Culture: NO GROWTH
Special Requests: ADEQUATE
Special Requests: ADEQUATE

## 2021-02-10 ENCOUNTER — Other Ambulatory Visit: Payer: Self-pay

## 2021-02-10 ENCOUNTER — Emergency Department (HOSPITAL_BASED_OUTPATIENT_CLINIC_OR_DEPARTMENT_OTHER)
Admission: EM | Admit: 2021-02-10 | Discharge: 2021-02-10 | Disposition: A | Discharge status: Home / Self Care | Payer: BLUE CROSS/BLUE SHIELD | Source: Home / Self Care

## 2021-02-10 ENCOUNTER — Encounter (HOSPITAL_BASED_OUTPATIENT_CLINIC_OR_DEPARTMENT_OTHER): Payer: Self-pay

## 2021-02-10 DIAGNOSIS — R1084 Generalized abdominal pain: Secondary | ICD-10-CM | POA: Insufficient documentation

## 2021-02-10 DIAGNOSIS — Z5321 Procedure and treatment not carried out due to patient leaving prior to being seen by health care provider: Secondary | ICD-10-CM | POA: Insufficient documentation

## 2021-02-10 DIAGNOSIS — R110 Nausea: Secondary | ICD-10-CM | POA: Insufficient documentation

## 2021-02-10 HISTORY — DX: Noninfective gastroenteritis and colitis, unspecified: K52.9

## 2021-02-10 LAB — COMPREHENSIVE METABOLIC PANEL
ALT: 13 U/L (ref 0–44)
AST: 15 U/L (ref 15–41)
Albumin: 4 g/dL (ref 3.5–5.0)
Alkaline Phosphatase: 67 U/L (ref 38–126)
Anion gap: 7 (ref 5–15)
BUN: 17 mg/dL (ref 6–20)
CO2: 25 mmol/L (ref 22–32)
Calcium: 9.1 mg/dL (ref 8.9–10.3)
Chloride: 102 mmol/L (ref 98–111)
Creatinine, Ser: 0.76 mg/dL (ref 0.44–1.00)
GFR, Estimated: >60 mL/min (ref >60)
Glucose, Bld: 108 mg/dL — ABNORMAL HIGH (ref 70–99)
Potassium: 4 mmol/L (ref 3.5–5.1)
Sodium: 134 mmol/L — ABNORMAL LOW (ref 135–145)
Total Bilirubin: 0.4 mg/dL (ref 0.3–1.2)
Total Protein: 7.4 g/dL (ref 6.5–8.1)

## 2021-02-10 LAB — CBC
HCT: 41.3 % (ref 36.0–46.0)
Hemoglobin: 13.8 g/dL (ref 12.0–15.0)
MCH: 29.4 pg (ref 26.0–34.0)
MCHC: 33.4 g/dL (ref 30.0–36.0)
MCV: 87.9 fL (ref 80.0–100.0)
Platelets: 266 10*3/uL (ref 150–400)
RBC: 4.7 MIL/uL (ref 3.87–5.11)
RDW: 12.9 % (ref 11.5–15.5)
WBC: 15.6 10*3/uL — ABNORMAL HIGH (ref 4.0–10.5)
nRBC: 0 % (ref 0.0–0.2)

## 2021-02-10 LAB — URINALYSIS, ROUTINE W REFLEX MICROSCOPIC
Bilirubin Urine: NEGATIVE
Glucose, UA: NEGATIVE mg/dL
Hgb urine dipstick: NEGATIVE
Ketones, ur: NEGATIVE mg/dL
Leukocytes,Ua: NEGATIVE
Nitrite: NEGATIVE
Protein, ur: NEGATIVE mg/dL
Specific Gravity, Urine: 1.025 (ref 1.005–1.030)
pH: 5.5 (ref 5.0–8.0)

## 2021-02-10 LAB — PREGNANCY, URINE: Preg Test, Ur: NEGATIVE

## 2021-02-10 LAB — LIPASE, BLOOD: Lipase: 28 U/L (ref 11–51)

## 2021-02-10 NOTE — ED Notes (Signed)
No answer to room 1

## 2021-02-10 NOTE — ED Triage Notes (Addendum)
Pt c/o diffuse abd pain, nausea started 330am-states she feels constipated because she did not have a BM this am-states she took-dulcolax pills/no BM-last BM yesterday am-pt states she had GI issues and dx with colitis after having covid in Feb-states she has appt with GI "at the end of June"-NAD-to triage in w/c

## 2021-02-11 ENCOUNTER — Inpatient Hospital Stay (HOSPITAL_COMMUNITY)
Admission: EM | Admit: 2021-02-11 | Discharge: 2021-02-20 | DRG: 854 | Disposition: A | Discharge status: Home Health | Payer: BLUE CROSS/BLUE SHIELD | Attending: Family Medicine | Admitting: Family Medicine

## 2021-02-11 ENCOUNTER — Encounter (HOSPITAL_COMMUNITY): Payer: Self-pay | Admitting: *Deleted

## 2021-02-11 ENCOUNTER — Emergency Department (HOSPITAL_COMMUNITY): Payer: BLUE CROSS/BLUE SHIELD

## 2021-02-11 ENCOUNTER — Other Ambulatory Visit: Payer: Self-pay

## 2021-02-11 DIAGNOSIS — Z885 Allergy status to narcotic agent status: Secondary | ICD-10-CM

## 2021-02-11 DIAGNOSIS — I10 Essential (primary) hypertension: Secondary | ICD-10-CM | POA: Diagnosis present

## 2021-02-11 DIAGNOSIS — A419 Sepsis, unspecified organism: Secondary | ICD-10-CM | POA: Diagnosis present

## 2021-02-11 DIAGNOSIS — Z886 Allergy status to analgesic agent status: Secondary | ICD-10-CM

## 2021-02-11 DIAGNOSIS — Z20822 Contact with and (suspected) exposure to covid-19: Secondary | ICD-10-CM | POA: Diagnosis present

## 2021-02-11 DIAGNOSIS — F32A Depression, unspecified: Secondary | ICD-10-CM | POA: Diagnosis present

## 2021-02-11 DIAGNOSIS — D72829 Elevated white blood cell count, unspecified: Secondary | ICD-10-CM

## 2021-02-11 DIAGNOSIS — Z9851 Tubal ligation status: Secondary | ICD-10-CM

## 2021-02-11 DIAGNOSIS — G4700 Insomnia, unspecified: Secondary | ICD-10-CM | POA: Diagnosis present

## 2021-02-11 DIAGNOSIS — Z882 Allergy status to sulfonamides status: Secondary | ICD-10-CM | POA: Diagnosis not present

## 2021-02-11 DIAGNOSIS — Z720 Tobacco use: Secondary | ICD-10-CM

## 2021-02-11 DIAGNOSIS — Z91040 Latex allergy status: Secondary | ICD-10-CM

## 2021-02-11 DIAGNOSIS — Z87891 Personal history of nicotine dependence: Secondary | ICD-10-CM | POA: Diagnosis not present

## 2021-02-11 DIAGNOSIS — E876 Hypokalemia: Secondary | ICD-10-CM | POA: Diagnosis present

## 2021-02-11 DIAGNOSIS — E669 Obesity, unspecified: Secondary | ICD-10-CM | POA: Diagnosis present

## 2021-02-11 DIAGNOSIS — Z6834 Body mass index (BMI) 34.0-34.9, adult: Secondary | ICD-10-CM | POA: Diagnosis not present

## 2021-02-11 DIAGNOSIS — K659 Peritonitis, unspecified: Secondary | ICD-10-CM | POA: Diagnosis present

## 2021-02-11 DIAGNOSIS — Z86010 Personal history of colonic polyps: Secondary | ICD-10-CM

## 2021-02-11 DIAGNOSIS — R1084 Generalized abdominal pain: Secondary | ICD-10-CM | POA: Diagnosis present

## 2021-02-11 DIAGNOSIS — F419 Anxiety disorder, unspecified: Secondary | ICD-10-CM | POA: Diagnosis not present

## 2021-02-11 DIAGNOSIS — Z8616 Personal history of COVID-19: Secondary | ICD-10-CM

## 2021-02-11 DIAGNOSIS — K5720 Diverticulitis of large intestine with perforation and abscess without bleeding: Secondary | ICD-10-CM | POA: Diagnosis present

## 2021-02-11 DIAGNOSIS — Z933 Colostomy status: Secondary | ICD-10-CM

## 2021-02-11 DIAGNOSIS — Z79899 Other long term (current) drug therapy: Secondary | ICD-10-CM | POA: Diagnosis not present

## 2021-02-11 DIAGNOSIS — Z800 Family history of malignant neoplasm of digestive organs: Secondary | ICD-10-CM

## 2021-02-11 LAB — COMPREHENSIVE METABOLIC PANEL
ALT: 11 U/L (ref 0–44)
AST: 13 U/L — ABNORMAL LOW (ref 15–41)
Albumin: 3.5 g/dL (ref 3.5–5.0)
Alkaline Phosphatase: 62 U/L (ref 38–126)
Anion gap: 10 (ref 5–15)
BUN: 13 mg/dL (ref 6–20)
CO2: 22 mmol/L (ref 22–32)
Calcium: 9.3 mg/dL (ref 8.9–10.3)
Chloride: 103 mmol/L (ref 98–111)
Creatinine, Ser: 0.8 mg/dL (ref 0.44–1.00)
GFR, Estimated: >60 mL/min (ref >60)
Glucose, Bld: 107 mg/dL — ABNORMAL HIGH (ref 70–99)
Potassium: 3.6 mmol/L (ref 3.5–5.1)
Sodium: 135 mmol/L (ref 135–145)
Total Bilirubin: 0.9 mg/dL (ref 0.3–1.2)
Total Protein: 7.1 g/dL (ref 6.5–8.1)

## 2021-02-11 LAB — URINALYSIS, ROUTINE W REFLEX MICROSCOPIC
Bacteria, UA: NONE SEEN
Bilirubin Urine: NEGATIVE
Glucose, UA: NEGATIVE mg/dL
Ketones, ur: NEGATIVE mg/dL
Nitrite: NEGATIVE
Protein, ur: NEGATIVE mg/dL
Specific Gravity, Urine: 1.017 (ref 1.005–1.030)
pH: 6 (ref 5.0–8.0)

## 2021-02-11 LAB — CBC
HCT: 40.9 % (ref 36.0–46.0)
Hemoglobin: 13.6 g/dL (ref 12.0–15.0)
MCH: 29 pg (ref 26.0–34.0)
MCHC: 33.3 g/dL (ref 30.0–36.0)
MCV: 87.2 fL (ref 80.0–100.0)
Platelets: 253 10*3/uL (ref 150–400)
RBC: 4.69 MIL/uL (ref 3.87–5.11)
RDW: 13.2 % (ref 11.5–15.5)
WBC: 19.5 10*3/uL — ABNORMAL HIGH (ref 4.0–10.5)
nRBC: 0 % (ref 0.0–0.2)

## 2021-02-11 LAB — RESP PANEL BY RT-PCR (FLU A&B, COVID) ARPGX2
Influenza A by PCR: NEGATIVE
Influenza B by PCR: NEGATIVE
SARS Coronavirus 2 by RT PCR: NEGATIVE

## 2021-02-11 LAB — LIPASE, BLOOD: Lipase: 20 U/L (ref 11–51)

## 2021-02-11 MED ORDER — HYDROCHLOROTHIAZIDE 12.5 MG PO CAPS
12.5000 mg | ORAL_CAPSULE | Freq: Every day | ORAL | Status: DC
  Filled 2021-02-11: qty 1

## 2021-02-11 MED ORDER — PIPERACILLIN-TAZOBACTAM 3.375 G IVPB
3.3750 g | Freq: Three times a day (TID) | INTRAVENOUS | Status: AC
  Administered 2021-02-12 – 2021-02-19 (×24): 3.375 g via INTRAVENOUS
  Filled 2021-02-11 (×24): qty 50

## 2021-02-11 MED ORDER — PIPERACILLIN-TAZOBACTAM 3.375 G IVPB 30 MIN
3.3750 g | Freq: Once | INTRAVENOUS | Status: AC
  Administered 2021-02-11: 3.375 g via INTRAVENOUS
  Filled 2021-02-11: qty 50

## 2021-02-11 MED ORDER — NICOTINE 7 MG/24HR TD PT24: 7.0000 mg | MEDICATED_PATCH | Freq: Every day | TRANSDERMAL | Status: DC

## 2021-02-11 MED ORDER — IOHEXOL 300 MG/ML  SOLN
100.0000 mL | Freq: Once | INTRAMUSCULAR | Status: AC | PRN
  Administered 2021-02-11: 100 mL via INTRAVENOUS

## 2021-02-11 MED ORDER — SODIUM CHLORIDE 0.9 % IV BOLUS
1000.0000 mL | Freq: Once | INTRAVENOUS | Status: AC
  Administered 2021-02-11: 1000 mL via INTRAVENOUS

## 2021-02-11 MED ORDER — MORPHINE SULFATE (PF) 4 MG/ML IV SOLN
4.0000 mg | Freq: Once | INTRAVENOUS | Status: AC
  Administered 2021-02-11: 4 mg via INTRAVENOUS
  Filled 2021-02-11: qty 1

## 2021-02-11 MED ORDER — ONDANSETRON HCL 4 MG PO TABS: 4.0000 mg | ORAL_TABLET | Freq: Four times a day (QID) | ORAL | Status: DC | PRN

## 2021-02-11 MED ORDER — HYDROMORPHONE HCL 1 MG/ML IJ SOLN
1.0000 mg | INTRAMUSCULAR | Status: DC | PRN
  Administered 2021-02-12 (×5): 1 mg via INTRAVENOUS
  Filled 2021-02-11 (×5): qty 1

## 2021-02-11 MED ORDER — TRAZODONE HCL 50 MG PO TABS
50.0000 mg | ORAL_TABLET | Freq: Every evening | ORAL | Status: DC | PRN
  Administered 2021-02-16 – 2021-02-19 (×3): 50 mg via ORAL
  Filled 2021-02-11 (×6): qty 1

## 2021-02-11 MED ORDER — ALPRAZOLAM 0.5 MG PO TABS
0.5000 mg | ORAL_TABLET | Freq: Every evening | ORAL | Status: DC | PRN
  Administered 2021-02-12: 0.5 mg via ORAL
  Filled 2021-02-11: qty 1
  Filled 2021-02-11: qty 2

## 2021-02-11 MED ORDER — LISINOPRIL 20 MG PO TABS
20.0000 mg | ORAL_TABLET | Freq: Every day | ORAL | Status: DC
  Filled 2021-02-11: qty 1

## 2021-02-11 MED ORDER — ONDANSETRON HCL 4 MG/2ML IJ SOLN
4.0000 mg | Freq: Once | INTRAMUSCULAR | Status: AC
  Administered 2021-02-11: 4 mg via INTRAVENOUS
  Filled 2021-02-11: qty 2

## 2021-02-11 MED ORDER — ONDANSETRON HCL 4 MG/2ML IJ SOLN
4.0000 mg | Freq: Four times a day (QID) | INTRAMUSCULAR | Status: DC | PRN
  Administered 2021-02-12: 4 mg via INTRAVENOUS
  Filled 2021-02-11: qty 2

## 2021-02-11 MED ORDER — LACTATED RINGERS IV SOLN: INTRAVENOUS | Status: DC

## 2021-02-11 MED ORDER — HYDROMORPHONE HCL 1 MG/ML IJ SOLN
1.0000 mg | Freq: Once | INTRAMUSCULAR | Status: AC
Start: 2021-02-11 — End: 2021-02-11
  Administered 2021-02-11: 1 mg via INTRAVENOUS
  Filled 2021-02-11: qty 1

## 2021-02-11 MED ORDER — BISOPROLOL-HYDROCHLOROTHIAZIDE 10-6.25 MG PO TABS
1.0000 | ORAL_TABLET | Freq: Every day | ORAL | Status: DC
  Administered 2021-02-12: 1 via ORAL
  Filled 2021-02-11: qty 1

## 2021-02-11 MED ORDER — LISINOPRIL-HYDROCHLOROTHIAZIDE 20-12.5 MG PO TABS: 1.0000 | ORAL_TABLET | Freq: Every day | ORAL | Status: DC

## 2021-02-11 MED ORDER — ACETAMINOPHEN 500 MG PO TABS
1000.0000 mg | ORAL_TABLET | Freq: Once | ORAL | Status: AC
  Administered 2021-02-11: 1000 mg via ORAL
  Filled 2021-02-11: qty 2

## 2021-02-11 MED ORDER — PIPERACILLIN-TAZOBACTAM 3.375 G IVPB: 3.3750 g | Freq: Three times a day (TID) | INTRAVENOUS | Status: DC

## 2021-02-11 NOTE — Consult Note (Signed)
Reason for Consult/Chief Complaint: perforated diverticulitis Consultant: Roxan Hockey, [place]  Hiilei Gerst is an 50 y.o. female.   HPI: 41F with abdominal pain that began on 6/5 at 0830. Pain became progressively worse, associated with anorexia, nausea, and dry heaving. Last BM 6/4. Last colonoscopy 2017, reports "cancerous polyps removed". Denies ever being diagnosed with diverticulosis. Reports an episode of colitis 09/2020 where she was hospitalized in [place] for four days and treated with IV abx and o/p abx x3 courses. Family history of colon cancer in paternal grandmother.   Prior BTL, incisional hernia repair x2 (one with mesh)  Vaccinated and boosted against COVID. Contracted COVID 08/2020.   Past Medical History:  Diagnosis Date  . Anxiety   . Colitis   . Hypertension   . Insomnia   . PTSD (post-traumatic stress disorder)   . Renal disorder     Past Surgical History:  Procedure Laterality Date  . BACK SURGERY    . HIP SURGERY Right   . TUBAL LIGATION      Family History  Problem Relation Age of Onset  . Cancer Mother   . Alzheimer's disease Father     Social History:  reports that she quit smoking about 3 months ago. Her smoking use included cigarettes. She smoked 0.50 packs per day. She has never used smokeless tobacco. She reports previous alcohol use. She reports previous drug use.  Allergies:  Allergies  Allergen Reactions  . Latex Rash  . Sulfa Antibiotics Nausea Only  . Toradol [Ketorolac Tromethamine] Nausea Only    Medications: I have reviewed the patient's current medications.  Results for orders placed or performed during the hospital encounter of 02/11/21 (from the past 48 hour(s))  Lipase, blood     Status: None   Collection Time: 02/11/21  1:15 PM  Result Value Ref Range   Lipase 20 11 - 51 U/L    Comment: Performed at [facility], [address] [place], [place] [address]  Comprehensive metabolic panel     Status: Abnormal    Collection Time: 02/11/21  1:15 PM  Result Value Ref Range   Sodium 135 135 - 145 mmol/L   Potassium 3.6 3.5 - 5.1 mmol/L   Chloride 103 98 - 111 mmol/L   CO2 22 22 - 32 mmol/L   Glucose, Bld 107 (H) 70 - 99 mg/dL    Comment: Glucose reference range applies only to samples taken after fasting for at least 8 hours.   BUN 13 6 - 20 mg/dL   Creatinine, Ser 6.38 0.44 - 1.00 mg/dL   Calcium 9.3 8.9 - 45.3 mg/dL   Total Protein 7.1 6.5 - 8.1 g/dL   Albumin 3.5 3.5 - 5.0 g/dL   AST 13 (L) 15 - 41 U/L   ALT 11 0 - 44 U/L   Alkaline Phosphatase 62 38 - 126 U/L   Total Bilirubin 0.9 0.3 - 1.2 mg/dL   GFR, Estimated >64 >68 mL/min    Comment: (NOTE) Calculated using the CKD-EPI Creatinine Equation (2021)    Anion gap 10 5 - 15    Comment: Performed at [facility], [address], [place], [place] [address]  CBC     Status: Abnormal   Collection Time: 02/11/21  1:15 PM  Result Value Ref Range   WBC 19.5 (H) 4.0 - 10.5 K/uL   RBC 4.69 3.87 - 5.11 MIL/uL   Hemoglobin 13.6 12.0 - 15.0 g/dL   HCT 24.8 25.0 - 03.7 %  MCV 87.2 80.0 - 100.0 fL   MCH 29.0 26.0 - 34.0 pg   MCHC 33.3 30.0 - 36.0 g/dL   RDW 80.9 98.3 - 38.2 %   Platelets 253 150 - 400 K/uL   nRBC 0.0 0.0 - 0.2 %    Comment: Performed at [facility], [address] [place], [place] [address]  Urinalysis, Routine w reflex microscopic Urine, Clean Catch     Status: Abnormal   Collection Time: 02/11/21  2:13 PM  Result Value Ref Range   Color, Urine YELLOW YELLOW   APPearance HAZY (A) CLEAR   Specific Gravity, Urine 1.017 1.005 - 1.030   pH 6.0 5.0 - 8.0   Glucose, UA NEGATIVE NEGATIVE mg/dL   Hgb urine dipstick SMALL (A) NEGATIVE   Bilirubin Urine NEGATIVE NEGATIVE   Ketones, ur NEGATIVE NEGATIVE mg/dL   Protein, ur NEGATIVE NEGATIVE mg/dL   Nitrite NEGATIVE NEGATIVE   Leukocytes,Ua TRACE (A) NEGATIVE   RBC / HPF 0-5 0 - 5 RBC/hpf   WBC, UA 6-10 0 - 5 WBC/hpf   Bacteria, UA NONE SEEN NONE SEEN   Squamous  Epithelial / LPF 11-20 0 - 5   Mucus PRESENT     Comment: Performed at [facility], [address], [place], [place] [address]  Resp Panel by RT-PCR (Flu A&B, Covid) Nasopharyngeal Swab     Status: None   Collection Time: 02/11/21  9:26 PM   Specimen: Nasopharyngeal Swab; Nasopharyngeal(NP) swabs in vial transport medium  Result Value Ref Range   SARS Coronavirus 2 by RT PCR NEGATIVE NEGATIVE    Comment: (NOTE) SARS-CoV-2 target nucleic acids are NOT DETECTED.  The SARS-CoV-2 RNA is generally detectable in upper respiratory specimens during the acute phase of infection. The lowest concentration of SARS-CoV-2 viral copies this assay can detect is 138 copies/mL. A negative result does not preclude SARS-Cov-2 infection and should not be used as the sole basis for treatment or other patient management decisions. A negative result may occur with  improper specimen collection/handling, submission of specimen other than nasopharyngeal swab, presence of viral mutation(s) within the areas targeted by this assay, and inadequate number of viral copies(<138 copies/mL). A negative result must be combined with clinical observations, patient history, and epidemiological information. The expected result is Negative.  Fact Sheet for Patients:  BloggerCourse.com  Fact Sheet for Healthcare Providers:  SeriousBroker.it  This test is no t yet approved or cleared by the [place] FDA and  has been authorized for detection and/or diagnosis of SARS-CoV-2 by FDA under an Emergency Use Authorization (EUA). This EUA will remain  in effect (meaning this test can be used) for the duration of the COVID-19 declaration under Section 564(b)(1) of the Act, 21 U.S.C.section 360bbb-3(b)(1), unless the authorization is terminated  or revoked sooner.       Influenza A by PCR NEGATIVE NEGATIVE   Influenza B by PCR NEGATIVE NEGATIVE    Comment:  (NOTE) The Xpert Xpress SARS-CoV-2/FLU/RSV plus assay is intended as an aid in the diagnosis of influenza from Nasopharyngeal swab specimens and should not be used as a sole basis for treatment. Nasal washings and aspirates are unacceptable for Xpert Xpress SARS-CoV-2/FLU/RSV testing.  Fact Sheet for Patients: BloggerCourse.com  Fact Sheet for Healthcare Providers: SeriousBroker.it  This test is not yet approved or cleared by the [place] FDA and has been authorized for detection and/or diagnosis of SARS-CoV-2 by FDA under an Emergency Use Authorization (EUA). This EUA will remain  in effect (meaning this test can be used) for the duration of the COVID-19 declaration under Section 564(b)(1) of the Act, 21 U.S.C. section 360bbb-3(b)(1), unless the authorization is terminated or revoked.  Performed at [facility], [address] [place], [place] [address]     CT Abdomen Pelvis W Contrast  Result Date: 02/11/2021 CLINICAL DATA:  Lower abdominal pain. EXAM: CT ABDOMEN AND PELVIS WITH CONTRAST TECHNIQUE: Multidetector CT imaging of the abdomen and pelvis was performed using the standard protocol following bolus administration of intravenous contrast. CONTRAST:  OMNIPAQUE IOHEXOL 300 MG/ML  SOLN COMPARISON:  October 23, 2020 FINDINGS: Lower chest: Mild linear scarring and/or atelectasis is seen within the posterior aspect of the bilateral lung bases. Hepatobiliary: No focal liver abnormality is seen. No gallstones, gallbladder wall thickening, or biliary dilatation. Pancreas: Unremarkable. No pancreatic ductal dilatation or surrounding inflammatory changes. Spleen: Normal in size without focal abnormality. Adrenals/Urinary Tract: Adrenal glands are unremarkable. 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 Bladder is unremarkable. Stomach/Bowel: There is a small hiatal  hernia. Appendix appears normal. No evidence of bowel dilatation. Numerous diverticula are seen within a markedly thickened and inflamed mid to distal sigmoid colon. A segment of adjacent markedly thickened ileum is present. A 1.2 cm x 1.1 cm focus of contained free air is seen in between these inflamed bowel loops (axial CT image 71, CT series number 3). Vascular/Lymphatic: No significant vascular findings are present. No enlarged abdominal or pelvic lymph nodes. Reproductive: Uterus and bilateral adnexa are unremarkable. Other: No abdominal wall hernia or abnormality. No abdominopelvic ascites. Musculoskeletal: Bilateral metallic density pedicle screws are seen at the levels of L5 and S1. IMPRESSION: 1. Marked severity colitis/diverticulitis along the mid to distal sigmoid colon, with marked severity enteritis involving an adjacent loop of ileum. 2. Small focus of adjacent contained free air consistent with associated perforation. 3. Crossed fused ectopia, as described above. 4. Postoperative changes within the lower lumbar spine. Electronically Signed   By: Aram Candela M.D.   On: 02/11/2021 20:35    ROS 10 point review of systems is negative except as listed above in HPI.   Physical Exam Blood pressure 129/80, pulse 95, temperature 98 F (36.7 C), temperature source Oral, resp. rate 20, SpO2 96 %. Constitutional: well-developed, well-nourished HEENT: pupils equal, round, reactive to light, 69mm b/l, moist conjunctiva, external inspection of ears and nose normal, hearing intact Oropharynx: normal oropharyngeal mucosa, normal dentition Neck: no thyromegaly, trachea midline, no midline cervical tenderness to palpation Chest: breath sounds equal bilaterally, normal respiratory effort, no midline or lateral chest wall tenderness to palpation/deformity Abdomen: soft, +TTP--b/l LQ, no bruising, no hepatosplenomegaly GU: normal female genitalia  Back: no wounds, no thoracic/lumbar spine tenderness to  palpation, no thoracic/lumbar spine stepoffs Rectal: deferred Extremities: 2+ radial and pedal pulses bilaterally, motor and sensation intact to bilateral UE and LE, no peripheral edema MSK: unable to assess gait/station, no clubbing/cyanosis of fingers/toes, normal ROM of all four extremities Skin: warm, dry, no rashes Psych: normal memory, normal mood/affect    Assessment/Plan: 19F with perforated diverticulitis. CT scan reviewed independently. Focal collection of air between loops of colon, but no free fluid. Recommend attempt at non-operative management with IV abx (already started on zosyn), hydration, and pain control. NPO for now. Discussed the possible need for operative intervention and colostomy. Patient verbalized understanding. Monitor abdominal exam.    Diamantina Monks, MD General and Trauma  [facility]

## 2021-02-11 NOTE — ED Triage Notes (Signed)
Pt arrived via EMS with sudden onset abdominal pain lower area that started yesterday am.  Pt has history of colitis. No BM since Saturday.  CB 130. VSS.

## 2021-02-11 NOTE — H&P (Signed)
History and Physical    Wendy Cooper BJY:[id number] DOB: 12/18/1970 DOA: 02/11/2021  PCP: Clemencia CourseHancock, Madison Frazier, PA-C   Patient coming from:  Home  Chief Complaint: Abdominal pain, nausea  HPI: Wendy Cooper is a 50 y.o. female with medical history significant for HTN, colitis, anxiety who presents for evaluation of severe abdominal pain over the past 3 days. Pain has progressively worsening.  She reports that the pain started on Sunday morning in her lower abdomen.  Now her entire abdomen is hurting and is a 10 out of 10 at worst.  Pain is exacerbated by movement, coughing.  [facility] to be evaluated but after waiting 7 hours she left without being seen.  She reports she has developed nausea but has not had vomiting or diarrhea with the abdominal pain.  She developed fever today.  She did not take any medications at home for her pain.  Pain was so severe yesterday she could not get out of bed.  Reports she has to hold her lower stomach when she walks to try to reduce the pain.  She has not had any injury or trauma to her abdomen.  Have a surgical history of tubal ligation and hernia repair. She smokes half pack of cigarettes a day.  She denies alcohol or illicit drug use.  ED Course: Tmax 101.2 F. BP 117-135/70-82.  The patient was tachycardic with heart rate improved after some IV fluids.  19,500 with normal hemoglobin hematocrit and platelet count.  CMP is unremarkable.  CT of her abdomen and pelvis reveals colitis with a perforation in the sigmoid colon with a air pocket of 1.1 to 1.3 cm that appears walled off.  COVID-19 and influenza swab is negative.  Surgery was consulted by the ER provider and surgery recommended medical management for now.  Admit surgery will evaluate and if patient does not improve may need surgical intervention with  Review of Systems:  General: Reports fever, chills. Denies weight loss, night sweats.  Denies dizziness. Reports  decreased appetite HENT: Denies head trauma, headache, denies change in hearing, tinnitus. Denies nasal congestion.  Denies sore throat.  Denies difficulty swallowing Eyes: Denies blurry vision, pain in eye, drainage. Denies discoloration of eyes. Neck: Denies pain.  Denies swelling.  Denies pain with movement. Cardiovascular: Denies chest pain, palpitations.  Denies edema.  Denies orthopnea Respiratory: Denies shortness of breath, cough.  Denies wheezing.  Denies sputum production Gastrointestinal: Reports abdominal pain. Reports nausea but no vomiting, diarrhea.  Denies melena.  Denies hematemesis. Musculoskeletal: Denies limitation of movement. Denies deformity or swelling. Denies arthralgias or myalgias. Genitourinary: Denies pelvic pain.  Denies urinary frequency or hesitancy.  Denies dysuria.  Skin: Denies rash.  Denies petechiae, purpura, ecchymosis. Neurological:  Denies syncope. Denies seizure activity. Denies paresthesia. Denies slurred speech, drooping face. Denies visual change. Psychiatric: Denies depression. Denies hallucinations.  Past Medical History:  Diagnosis Date  . Anxiety   . Colitis   . Hypertension   . Insomnia   . PTSD (post-traumatic stress disorder)   . Renal disorder     Past Surgical History:  Procedure Laterality Date  . BACK SURGERY    . HIP SURGERY Right   . TUBAL LIGATION      Social History  reports that she quit smoking about 3 months ago. Her smoking use included cigarettes. She smoked 0.50 packs per day. She has never used smokeless tobacco. She reports previous alcohol use. She reports previous drug use.  Allergies  Allergen Reactions  . Latex   . Sulfa Antibiotics   . Toradol [Ketorolac Tromethamine]     Family History  Problem Relation Age of Onset  . Cancer Mother   . Alzheimer's disease Father      Prior to Admission medications   Medication Sig Start Date End Date Taking? Authorizing Provider  alprazolam Prudy Feeler) 2 MG tablet  Take by mouth. 03/08/20   [provider]  bisoprolol-hydrochlorothiazide (ZIAC) 10-6.25 MG tablet Take 1 tablet by mouth at bedtime. 08/27/20   [provider]  ciprofloxacin (CIPRO) 500 MG tablet TAKE 1 TABLET (500 MG TOTAL) BY MOUTH EVERY 12 (TWELVE) HOURS FOR 7 DAYS. 10/23/20 10/23/21  Terrilee Files, MD  ciprofloxacin (CIPRO) 500 MG tablet TAKE 1 TABLET BY MOUTH EVERY 12 HOURS FOR 7 DAYS 10/18/20 10/18/21  Milagros Loll, MD  DULoxetine (CYMBALTA) 60 MG capsule Take by mouth. 02/18/20   [provider]  fluticasone (FLONASE) 50 MCG/ACT nasal spray Place 2 sprays into both nostrils daily. 12/20/19   Saguier, Ramon Dredge, PA-C  HYDROcodone-acetaminophen (NORCO/VICODIN) 5-325 MG tablet Take 1 tablet by mouth every 4 (four) hours as needed. 07/30/20   Sabas Sous, MD  lidocaine (LIDODERM) 5 % Place 1 patch onto the skin daily. Remove & Discard patch within 12 hours or as directed by MD 08/16/20   Couture, Cortni S, PA-C  lisinopril-hydrochlorothiazide (ZESTORETIC) 20-12.5 MG tablet Take 1 tablet by mouth daily. 02/18/20   [provider]  metroNIDAZOLE (FLAGYL) 500 MG tablet TAKE 1 TABLET (500 MG TOTAL) BY MOUTH 2 (TWO) TIMES DAILY FOR 7 DAYS. 10/23/20 10/23/21  Terrilee Files, MD  metroNIDAZOLE (FLAGYL) 500 MG tablet TAKE 1 TABLET BY MOUTH TWICE DAILY FOR 7 DAYS 10/18/20 10/18/21  Milagros Loll, MD  mirtazapine (REMERON) 15 MG tablet Take by mouth. 03/11/20   [provider]  naproxen (NAPROSYN) 500 MG tablet Take 1 tablet (500 mg total) by mouth 2 (two) times daily. 07/30/20   Sabas Sous, MD  ondansetron (ZOFRAN ODT) 4 MG disintegrating tablet 4mg  ODT q4 hours prn nausea/vomit 10/16/20   12/14/20, MD  oxyCODONE-acetaminophen (PERCOCET/ROXICET) 5-325 MG tablet Take 1 tablet by mouth every 6 (six) hours as needed for severe pain. 10/23/20   10/25/20, MD  oxyCODONE-acetaminophen (PERCOCET/ROXICET) 5-325 MG tablet TAKE 1 TABLET BY MOUTH  EVERY 6 (SIX) HOURS AS NEEDED FOR SEVERE PAIN. 10/23/20 04/21/21  04/23/21, MD  oxyCODONE-acetaminophen (PERCOCET/ROXICET) 5-325 MG tablet TAKE 2 TABLETS BY MOUTH EVERY 4 HOURS AS NEEDED FOR SEVERE PAIN 10/18/20 04/16/21  06/16/21, MD  pantoprazole (PROTONIX) 40 MG tablet Take 1 tablet (40 mg total) by mouth daily. 10/18/20 11/17/20  11/19/20, MD  pantoprazole (PROTONIX) 40 MG tablet TAKE 1 TABLET BY MOUTH ONCE DAILY 10/18/20 10/18/21  12/16/21, MD  predniSONE (STERAPRED UNI-PAK 21 TAB) 10 MG (21) TBPK tablet Take by mouth daily. Take 6 tabs by mouth daily  for 2 days, then 5 tabs for 2 days, then 4 tabs for 2 days, then 3 tabs for 2 days, 2 tabs for 2 days, then 1 tab by mouth daily for 2 days 08/16/20   Couture, Cortni S, PA-C  traZODone (DESYREL) 50 MG tablet TAKE 1 TO 2 TABLETS AT BEDTIME AS NEEDED FOR INSOMNIA 02/06/20   04/07/20, PA-C    Physical Exam: Vitals:   02/11/21 1658 02/11/21 1937 02/11/21 2124 02/11/21 2145  BP: 135/79 126/75 112/62 113/71  Pulse: (!) 105 93 97 99  Resp: 17 (!) 21 20   Temp: (!) 101.2 F (38.4 C) 99 F (37.2 C) 98 F (36.7 C)   TempSrc: Oral Oral Oral   SpO2: 96% 95% 97% 98%    Constitutional: NAD, calm, comfortable Vitals:   02/11/21 1658 02/11/21 1937 02/11/21 2124 02/11/21 2145  BP: 135/79 126/75 112/62 113/71  Pulse: (!) 105 93 97 99  Resp: 17 (!) 21 20   Temp: (!) 101.2 F (38.4 C) 99 F (37.2 C) 98 F (36.7 C)   TempSrc: Oral Oral Oral   SpO2: 96% 95% 97% 98%   General: WDWN, Alert and oriented x3.  Eyes: EOMI, PERRL, conjunctivae normal. Sclera nonicteric HENT:  [place]/AT, external ears normal.  Nares patent without epistasis.  Mucous membranes are dry Neck: Soft, normal range of motion, supple, no masses, Trachea midline Respiratory: clear to auscultation bilaterally, no wheezing, no crackles. Normal respiratory effort. No accessory muscle use.  Cardiovascular: Regular rate and rhythm, no murmurs / rubs /  gallops. No extremity edema. 2+ pedal pulses Abdomen: Soft, Tenderness diffusely but most pronounced in left lower quadrent, nondistended, Has rebound, no guarding.  No masses palpated. Obese. Bowel sounds hypoactive Musculoskeletal: FROM. no cyanosis. No joint deformity upper and lower extremities. Normal muscle tone.  Skin: Warm, dry, intact no rashes, lesions, ulcers. No induration Neurologic: CN 2-12 grossly intact.  Normal speech. Sensation intact. Strength 5/5 in all extremities.   Psychiatric: Normal judgment and insight.  Normal mood.    Labs on Admission: I have personally reviewed following labs and imaging studies  CBC: Recent Labs  Lab 02/10/21 1816 02/11/21 1315  WBC 15.6* 19.5*  HGB 13.8 13.6  HCT 41.3 40.9  MCV 87.9 87.2  PLT 266 253    Basic Metabolic Panel: Recent Labs  Lab 02/10/21 1816 02/11/21 1315  NA 134* 135  K 4.0 3.6  CL 102 103  CO2 25 22  GLUCOSE 108* 107*  BUN 17 13  CREATININE 0.76 0.80  CALCIUM 9.1 9.3    GFR: Estimated Creatinine Clearance: 106.4 mL/min (by C-G formula based on SCr of 0.8 mg/dL).  Liver Function Tests: Recent Labs  Lab 02/10/21 1816 02/11/21 1315  AST 15 13*  ALT 13 11  ALKPHOS 67 62  BILITOT 0.4 0.9  PROT 7.4 7.1  ALBUMIN 4.0 3.5    Urine analysis:    Component Value Date/Time   COLORURINE YELLOW 02/11/2021 1413   APPEARANCEUR HAZY (A) 02/11/2021 1413   LABSPEC 1.017 02/11/2021 1413   PHURINE 6.0 02/11/2021 1413   GLUCOSEU NEGATIVE 02/11/2021 1413   HGBUR SMALL (A) 02/11/2021 1413   BILIRUBINUR NEGATIVE 02/11/2021 1413   KETONESUR NEGATIVE 02/11/2021 1413   PROTEINUR NEGATIVE 02/11/2021 1413   NITRITE NEGATIVE 02/11/2021 1413   LEUKOCYTESUR TRACE (A) 02/11/2021 1413    Radiological Exams on Admission: CT Abdomen Pelvis W Contrast  Result Date: 02/11/2021 CLINICAL DATA:  Lower abdominal pain. EXAM: CT ABDOMEN AND PELVIS WITH CONTRAST TECHNIQUE: Multidetector CT imaging of the abdomen and pelvis was  performed using the standard protocol following bolus administration of intravenous contrast. CONTRAST:  OMNIPAQUE IOHEXOL 300 MG/ML  SOLN COMPARISON:  October 23, 2020 FINDINGS: Lower chest: Mild linear scarring and/or atelectasis is seen within the posterior aspect of the bilateral lung bases. Hepatobiliary: No focal liver abnormality is seen. No gallstones, gallbladder wall thickening, or biliary dilatation. Pancreas: Unremarkable. No pancreatic ductal dilatation or surrounding inflammatory changes. Spleen: Normal in size without focal abnormality.  Adrenals/Urinary Tract: Adrenal glands are unremarkable. 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 Bladder is unremarkable. Stomach/Bowel: There is a small hiatal hernia. Appendix appears normal. No evidence of bowel dilatation. Numerous diverticula are seen within a markedly thickened and inflamed mid to distal sigmoid colon. A segment of adjacent markedly thickened ileum is present. A 1.2 cm x 1.1 cm focus of contained free air is seen in between these inflamed bowel loops (axial CT image 71, CT series number 3). Vascular/Lymphatic: No significant vascular findings are present. No enlarged abdominal or pelvic lymph nodes. Reproductive: Uterus and bilateral adnexa are unremarkable. Other: No abdominal wall hernia or abnormality. No abdominopelvic ascites. Musculoskeletal: Bilateral metallic density pedicle screws are seen at the levels of L5 and S1. IMPRESSION: 1. Marked severity colitis/diverticulitis along the mid to distal sigmoid colon, with marked severity enteritis involving an adjacent loop of ileum. 2. Small focus of adjacent contained free air consistent with associated perforation. 3. Crossed fused ectopia, as described above. 4. Postoperative changes within the lower lumbar spine. Electronically Signed   By: Aram Candela M.D.   On: 02/11/2021 20:35    Assessment/Plan Principal  Problem:   Diverticulitis of colon with perforation Ms. Wheeless is admitted to Med/Surg floor.  Surgery is consulted and will see.  Zosyn for antibiotic coverage provided.  Pain control with Dilaudid IV overnight as needed.  IVF hydration with LR at 125 ml/hr Check electrolytes and renal function in am  Active Problems:   Essential hypertension Continue home medication of lisinopril/hctz and Ziac. Monitor BP    Leukocytosis Monitor CBC with labs in am    Anxiety Continue home mediation of xanax     Tobacco use Nicotine patch provided to prevent withdrawls.  Smoking cessation education before discharge.    DVT prophylaxis: Padua score low (2). Early ambulation and TED hose for DVT prophylaxis.   Code Status:   Full code  Family Communication:  Diagnosis and plan discussed with patient.  Patient verbalized understanding and agrees with plan.  Further recommendations to follow as clinically indicated Disposition Plan:   Patient is from:  Home  Anticipated DC to:  Home  Anticipated DC date:  Anticipate 2 midnight or more stay in the hospital to treat acute condition  Anticipated DC barriers: No barriers to discharge identified at this time  Consults called:  Surgery,  Dr. Bedelia Person Admission status:  Inpatient  Claudean Severance Brianna Esson MD [facility]  How to contact the [facility] Attending or Consulting provider 7A - 7P or covering provider during after hours 7P -7A, for this patient?   1. Check the care team in [facility] and look for a) attending/consulting [facility] provider listed and b) the [facility] team listed 2. Log into [email or web address] and use [place]'s universal password to access. If you do not have the password, please contact the hospital operator. 3. Locate the [facility] provider you are looking for under [facility] and page to a number that you can be directly reached. 4. If you still have difficulty reaching the provider, please page the [facility] (Director on Call) for the Hospitalists  listed on amion for assistance.  02/11/2021, 11:02 PM

## 2021-02-11 NOTE — ED Notes (Signed)
Admitting provider at bedside.

## 2021-02-11 NOTE — ED Provider Notes (Signed)
[facility] EMERGENCY DEPARTMENT Provider Note   CSN: [id number] Arrival date & time: 02/11/21  1330     History Chief Complaint  Patient presents with  . Abdominal Pain    Wendy Cooper is a 50 y.o. female with PMHx HTN, colitis, presenting for evaluation of progressively worsening abdominal pain that began early Sunday morning.  Pain has been gradually worsening over the days.  She has associated nausea and poor appetite.  Denies diarrhea or fevers at home.  Pain is across her lower abdomen.  Has some pain with urination as well.  Past abdominal surgeries include tubal ligation and hernia repair. States she checked into [facility] ED yesterday, however left due to wait times.  The history is provided by the patient.       Past Medical History:  Diagnosis Date  . Anxiety   . Colitis   . Hypertension   . Insomnia   . PTSD (post-traumatic stress disorder)   . Renal disorder     Patient Active Problem List   Diagnosis Date Noted  . Diverticulitis of colon with perforation 02/11/2021  . Leukocytosis 02/11/2021  . Anxiety 02/11/2021  . Tobacco use 02/11/2021  . Essential hypertension 12/20/2019    Past Surgical History:  Procedure Laterality Date  . BACK SURGERY    . HIP SURGERY Right   . TUBAL LIGATION       OB History   No obstetric history on file.     Family History  Problem Relation Age of Onset  . Cancer Mother   . Alzheimer's disease Father     Social History   Tobacco Use  . Smoking status: Former Smoker    Packs/day: 0.50    Types: Cigarettes    Quit date: 10/13/2020    Years since quitting: 0.3  . Smokeless tobacco: Never Used  Vaping Use  . Vaping Use: Never used  Substance Use Topics  . Alcohol use: Not Currently  . Drug use: Not Currently    Home Medications Prior to Admission medications   Medication Sig Start Date End Date Taking? Authorizing Provider  alprazolam Prudy Feeler) 2 MG tablet Take by mouth.  03/08/20   [provider]  bisoprolol-hydrochlorothiazide (ZIAC) 10-6.25 MG tablet Take 1 tablet by mouth at bedtime. 08/27/20   [provider]  ciprofloxacin (CIPRO) 500 MG tablet TAKE 1 TABLET (500 MG TOTAL) BY MOUTH EVERY 12 (TWELVE) HOURS FOR 7 DAYS. 10/23/20 10/23/21  Terrilee Files, MD  ciprofloxacin (CIPRO) 500 MG tablet TAKE 1 TABLET BY MOUTH EVERY 12 HOURS FOR 7 DAYS 10/18/20 10/18/21  Milagros Loll, MD  DULoxetine (CYMBALTA) 60 MG capsule Take by mouth. 02/18/20   [provider]  fluticasone (FLONASE) 50 MCG/ACT nasal spray Place 2 sprays into both nostrils daily. 12/20/19   Saguier, Ramon Dredge, PA-C  HYDROcodone-acetaminophen (NORCO/VICODIN) 5-325 MG tablet Take 1 tablet by mouth every 4 (four) hours as needed. 07/30/20   Sabas Sous, MD  lidocaine (LIDODERM) 5 % Place 1 patch onto the skin daily. Remove & Discard patch within 12 hours or as directed by MD 08/16/20   Couture, Cortni S, PA-C  lisinopril-hydrochlorothiazide (ZESTORETIC) 20-12.5 MG tablet Take 1 tablet by mouth daily. 02/18/20   [provider]  metroNIDAZOLE (FLAGYL) 500 MG tablet TAKE 1 TABLET (500 MG TOTAL) BY MOUTH 2 (TWO) TIMES DAILY FOR 7 DAYS. 10/23/20 10/23/21  Terrilee Files, MD  metroNIDAZOLE (FLAGYL) 500 MG tablet TAKE 1 TABLET BY MOUTH TWICE  DAILY FOR 7 DAYS 10/18/20 10/18/21  Milagros Loll, MD  mirtazapine (REMERON) 15 MG tablet Take by mouth. 03/11/20   [provider]  naproxen (NAPROSYN) 500 MG tablet Take 1 tablet (500 mg total) by mouth 2 (two) times daily. 07/30/20   Sabas Sous, MD  ondansetron (ZOFRAN ODT) 4 MG disintegrating tablet 4mg  ODT q4 hours prn nausea/vomit 10/16/20   12/14/20, MD  oxyCODONE-acetaminophen (PERCOCET/ROXICET) 5-325 MG tablet Take 1 tablet by mouth every 6 (six) hours as needed for severe pain. 10/23/20   10/25/20, MD  oxyCODONE-acetaminophen (PERCOCET/ROXICET) 5-325 MG tablet TAKE 1 TABLET BY MOUTH EVERY 6 (SIX)  HOURS AS NEEDED FOR SEVERE PAIN. 10/23/20 04/21/21  04/23/21, MD  oxyCODONE-acetaminophen (PERCOCET/ROXICET) 5-325 MG tablet TAKE 2 TABLETS BY MOUTH EVERY 4 HOURS AS NEEDED FOR SEVERE PAIN 10/18/20 04/16/21  06/16/21, MD  pantoprazole (PROTONIX) 40 MG tablet Take 1 tablet (40 mg total) by mouth daily. 10/18/20 11/17/20  11/19/20, MD  pantoprazole (PROTONIX) 40 MG tablet TAKE 1 TABLET BY MOUTH ONCE DAILY 10/18/20 10/18/21  12/16/21, MD  predniSONE (STERAPRED UNI-PAK 21 TAB) 10 MG (21) TBPK tablet Take by mouth daily. Take 6 tabs by mouth daily  for 2 days, then 5 tabs for 2 days, then 4 tabs for 2 days, then 3 tabs for 2 days, 2 tabs for 2 days, then 1 tab by mouth daily for 2 days 08/16/20   Couture, Cortni S, PA-C  traZODone (DESYREL) 50 MG tablet TAKE 1 TO 2 TABLETS AT BEDTIME AS NEEDED FOR INSOMNIA 02/06/20   Saguier, 04/07/20, PA-C    Allergies    Latex, Sulfa antibiotics, and Toradol [ketorolac tromethamine]  Review of Systems   Review of Systems  Gastrointestinal: Positive for abdominal pain and nausea.  Genitourinary: Positive for dysuria.  All other systems reviewed and are negative.   Physical Exam Updated Vital Signs BP 133/82   Pulse 91   Temp 98 F (36.7 C) (Oral)   Resp 20   SpO2 96%   Physical Exam Vitals and nursing note reviewed.  Constitutional:      Appearance: She is well-developed.  HENT:     Head: Normocephalic and atraumatic.  Eyes:     Conjunctiva/sclera: Conjunctivae normal.  Cardiovascular:     Rate and Rhythm: Normal rate and regular rhythm.  Pulmonary:     Effort: Pulmonary effort is normal. No respiratory distress.     Breath sounds: Normal breath sounds.  Abdominal:     General: Bowel sounds are normal.     Palpations: Abdomen is soft.     Tenderness: There is generalized abdominal tenderness. There is guarding. There is no rebound.  Skin:    General: Skin is warm.  Neurological:     Mental Status: She is alert.   Psychiatric:        Behavior: Behavior normal.     ED Results / Procedures / Treatments   Labs (all labs ordered are listed, but only abnormal results are displayed) Labs Reviewed  COMPREHENSIVE METABOLIC PANEL - Abnormal; Notable for the following components:      Result Value   Glucose, Bld 107 (*)    AST 13 (*)    All other components within normal limits  CBC - Abnormal; Notable for the following components:   WBC 19.5 (*)    All other components within normal limits  URINALYSIS, ROUTINE W REFLEX MICROSCOPIC - Abnormal; Notable for the following  components:   APPearance HAZY (*)    Hgb urine dipstick SMALL (*)    Leukocytes,Ua TRACE (*)    All other components within normal limits  RESP PANEL BY RT-PCR (FLU A&B, COVID) ARPGX2  LIPASE, BLOOD  LACTIC ACID, PLASMA  LACTIC ACID, PLASMA  HIV ANTIBODY (ROUTINE TESTING W REFLEX)  BASIC METABOLIC PANEL  CBC  I-STAT BETA HCG BLOOD, ED ([facility], [facility], [facility] ONLY)    EKG None  Radiology CT Abdomen Pelvis W Contrast  Result Date: 02/11/2021 CLINICAL DATA:  Lower abdominal pain. EXAM: CT ABDOMEN AND PELVIS WITH CONTRAST TECHNIQUE: Multidetector CT imaging of the abdomen and pelvis was performed using the standard protocol following bolus administration of intravenous contrast. CONTRAST:  OMNIPAQUE IOHEXOL 300 MG/ML  SOLN COMPARISON:  October 23, 2020 FINDINGS: Lower chest: Mild linear scarring and/or atelectasis is seen within the posterior aspect of the bilateral lung bases. Hepatobiliary: No focal liver abnormality is seen. No gallstones, gallbladder wall thickening, or biliary dilatation. Pancreas: Unremarkable. No pancreatic ductal dilatation or surrounding inflammatory changes. Spleen: Normal in size without focal abnormality. Adrenals/Urinary Tract: Adrenal glands are unremarkable. 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 Bladder is unremarkable. Stomach/Bowel: There is a  small hiatal hernia. Appendix appears normal. No evidence of bowel dilatation. Numerous diverticula are seen within a markedly thickened and inflamed mid to distal sigmoid colon. A segment of adjacent markedly thickened ileum is present. A 1.2 cm x 1.1 cm focus of contained free air is seen in between these inflamed bowel loops (axial CT image 71, CT series number 3). Vascular/Lymphatic: No significant vascular findings are present. No enlarged abdominal or pelvic lymph nodes. Reproductive: Uterus and bilateral adnexa are unremarkable. Other: No abdominal wall hernia or abnormality. No abdominopelvic ascites. Musculoskeletal: Bilateral metallic density pedicle screws are seen at the levels of L5 and S1. IMPRESSION: 1. Marked severity colitis/diverticulitis along the mid to distal sigmoid colon, with marked severity enteritis involving an adjacent loop of ileum. 2. Small focus of adjacent contained free air consistent with associated perforation. 3. Crossed fused ectopia, as described above. 4. Postoperative changes within the lower lumbar spine. Electronically Signed   By: Aram Candela M.D.   On: 02/11/2021 20:35    Procedures .Critical Care Performed by: Courage Biglow, [place] N, PA-C Authorized by: Amayrani Bennick, [place] N, PA-C   Critical care provider statement:    Critical care time (minutes):  45   Critical care time was exclusive of:  Separately billable procedures and treating other patients and teaching time   Critical care was necessary to treat or prevent imminent or life-threatening deterioration of the following conditions: perforated bowel.   Critical care was time spent personally by me on the following activities:  Discussions with consultants, evaluation of patient's response to treatment, examination of patient, ordering and performing treatments and interventions, ordering and review of laboratory studies, ordering and review of radiographic studies, pulse oximetry, re-evaluation of patient's  condition, obtaining history from patient or surrogate and review of old charts   I assumed direction of critical care for this patient from another provider in my specialty: no     Care discussed with: admitting provider     Care discussed with comment:  Surgeon     Medications Ordered in ED Medications  bisoprolol-hydrochlorothiazide (ZIAC) 10-6.25 MG per tablet 1 tablet (has no administration in time range)  ALPRAZolam (XANAX) tablet 0.5 mg (has no administration in time range)  traZODone (DESYREL) tablet 50 mg (has no administration in time range)  lactated ringers infusion (has no administration in time range)  HYDROmorphone (DILAUDID) injection 1 mg (has no administration in time range)  ondansetron (ZOFRAN) tablet 4 mg (has no administration in time range)    Or  ondansetron (ZOFRAN) injection 4 mg (has no administration in time range)  nicotine (NICODERM CQ - dosed in mg/24 hr) patch 7 mg (has no administration in time range)  piperacillin-tazobactam (ZOSYN) IVPB 3.375 g (has no administration in time range)  acetaminophen (TYLENOL) tablet 1,000 mg (1,000 mg Oral Given 02/11/21 1728)  morphine 4 MG/ML injection 4 mg (4 mg Intravenous Given 02/11/21 1757)  iohexol (OMNIPAQUE) 300 MG/ML solution 100 mL (100 mLs Intravenous Contrast Given 02/11/21 2007)  piperacillin-tazobactam (ZOSYN) IVPB 3.375 g (3.375 g Intravenous New Bag/Given 02/11/21 2157)  sodium chloride 0.9 % bolus 1,000 mL (1,000 mLs Intravenous New Bag/Given 02/11/21 2151)  ondansetron (ZOFRAN) injection 4 mg (4 mg Intravenous Given 02/11/21 2152)  HYDROmorphone (DILAUDID) injection 1 mg (1 mg Intravenous Given 02/11/21 2153)    ED Course  I have reviewed the triage vital signs and the nursing notes.  Pertinent labs & imaging results that were available during my care of the patient were reviewed by me and considered in my medical decision making (see chart for details).  Clinical Course as of 02/11/21 2316  Tue Feb 11, 2021   2042  I spoke with radiology, she has contained free air/perf.  [EH]  2045 Messaged charge nurse to ask for a room.  [EH]    Clinical Course User Index [EH] Norman ClayHammond, Elizabeth W, PA-C   MDM Rules/Calculators/A&P                          Patient is a 50 year old female presenting for worsening abdominal pain since early Sunday morning.  Patient was screened in triage by medical provider who ordered CT scan and blood work.  CT scan is positive for diverticulitis with evidence of perforation.  She did have a fever here in the ED of 101.2 F which was treated with Tylenol with improvement. She does not meet sepsis criteria. On exam she is generalized abdominal tenderness with some guarding.  Has had some sips of water throughout the day though no solid foods.  Encourage she remain n.p.o.  COVID swab is ordered as well as Zosyn, IV fluids, pain medication and antiemetics.  Consult placed to general surgery, Dr. Bedelia PersonLovick.  Recommends medical management at this time, general surgery to consult.  Consult placed to unassigned, [facility] accepting admission.   Final Clinical Impression(s) / ED Diagnoses Final diagnoses:  Diverticulitis of large intestine with perforation without abscess or bleeding    Rx / DC Orders ED Discharge Orders    None       Terrell Shimko, [place] N, PA-C 02/11/21 2316    Eber HongMiller, Brian, MD 02/12/21 [phone]

## 2021-02-11 NOTE — ED Provider Notes (Signed)
Emergency Medicine Provider Triage Evaluation Note  Wendy Cooper , a 50 y.o. female  was evaluated in triage.  Pt complains of abdominal pain.  Pain started yesterday at 3am.  Pain is worsening.  She didn't have a fever.  She reports dysuria.  Review of Systems  Positive: Dysuria, lower abdominal pain Negative: Fever, diarrhea  Physical Exam  BP 117/75 (BP Location: Right Arm)   Pulse (!) 107   Temp 98.6 F (37 C) (Oral)   Resp 18   SpO2 98%  Gen:   Awake, no distress   Resp:  Normal effort  MSK:   Moves extremities without difficulty  Other:  Abdomen TTP lower abdomen.   Medical Decision Making  Medically screening exam initiated at 1:36 PM.  Appropriate orders placed.  Wendy Cooper was informed that the remainder of the evaluation will be completed by another provider, this initial triage assessment does not replace that evaluation, and the importance of remaining in the ED until their evaluation is complete.     Cristina Gong, PA-C 02/11/21 1338    Arby Barrette, MD 02/18/21 2145

## 2021-02-12 DIAGNOSIS — Z720 Tobacco use: Secondary | ICD-10-CM

## 2021-02-12 DIAGNOSIS — I10 Essential (primary) hypertension: Secondary | ICD-10-CM

## 2021-02-12 DIAGNOSIS — F419 Anxiety disorder, unspecified: Secondary | ICD-10-CM

## 2021-02-12 LAB — CBC
HCT: 36.6 % (ref 36.0–46.0)
Hemoglobin: 12 g/dL (ref 12.0–15.0)
MCH: 29.3 pg (ref 26.0–34.0)
MCHC: 32.8 g/dL (ref 30.0–36.0)
MCV: 89.5 fL (ref 80.0–100.0)
Platelets: 190 10*3/uL (ref 150–400)
RBC: 4.09 MIL/uL (ref 3.87–5.11)
RDW: 13.2 % (ref 11.5–15.5)
WBC: 17 10*3/uL — ABNORMAL HIGH (ref 4.0–10.5)
nRBC: 0 % (ref 0.0–0.2)

## 2021-02-12 LAB — BASIC METABOLIC PANEL
Anion gap: 11 (ref 5–15)
BUN: 9 mg/dL (ref 6–20)
CO2: 23 mmol/L (ref 22–32)
Calcium: 9 mg/dL (ref 8.9–10.3)
Chloride: 102 mmol/L (ref 98–111)
Creatinine, Ser: 0.74 mg/dL (ref 0.44–1.00)
GFR, Estimated: >60 mL/min (ref >60)
Glucose, Bld: 102 mg/dL — ABNORMAL HIGH (ref 70–99)
Potassium: 3.4 mmol/L — ABNORMAL LOW (ref 3.5–5.1)
Sodium: 136 mmol/L (ref 135–145)

## 2021-02-12 LAB — LACTIC ACID, PLASMA
Lactic Acid, Venous: 1.1 mmol/L (ref 0.5–1.9)
Lactic Acid, Venous: 0.8 mmol/L (ref 0.5–1.9)

## 2021-02-12 MED ORDER — ENOXAPARIN SODIUM 40 MG/0.4ML IJ SOSY
40.0000 mg | PREFILLED_SYRINGE | INTRAMUSCULAR | Status: DC
  Administered 2021-02-12 – 2021-02-13 (×2): 40 mg via SUBCUTANEOUS
  Filled 2021-02-12 (×2): qty 0.4

## 2021-02-12 MED ORDER — HYDROMORPHONE HCL 1 MG/ML IJ SOLN
1.0000 mg | Freq: Once | INTRAMUSCULAR | Status: DC
  Filled 2021-02-12: qty 1

## 2021-02-12 MED ORDER — LACTATED RINGERS IV BOLUS
500.0000 mL | Freq: Once | INTRAVENOUS | Status: AC
  Administered 2021-02-12: 500 mL via INTRAVENOUS

## 2021-02-12 MED ORDER — METHOCARBAMOL 1000 MG/10ML IJ SOLN
500.0000 mg | Freq: Four times a day (QID) | INTRAVENOUS | Status: DC | PRN
  Administered 2021-02-12: 500 mg via INTRAVENOUS
  Filled 2021-02-12: qty 5
  Filled 2021-02-12: qty 500

## 2021-02-12 MED ORDER — NICOTINE 7 MG/24HR TD PT24
7.0000 mg | MEDICATED_PATCH | Freq: Every day | TRANSDERMAL | Status: DC
  Administered 2021-02-12 – 2021-02-20 (×10): 7 mg via TRANSDERMAL
  Filled 2021-02-12 (×11): qty 1

## 2021-02-12 MED ORDER — FENTANYL CITRATE (PF) 100 MCG/2ML IJ SOLN
50.0000 ug | INTRAMUSCULAR | Status: DC | PRN
  Administered 2021-02-12: 50 ug via INTRAVENOUS
  Filled 2021-02-12: qty 2

## 2021-02-12 MED ORDER — KETOROLAC TROMETHAMINE 15 MG/ML IJ SOLN
15.0000 mg | Freq: Once | INTRAMUSCULAR | Status: AC
  Administered 2021-02-12: 15 mg via INTRAVENOUS
  Filled 2021-02-12: qty 1

## 2021-02-12 MED ORDER — KCL IN DEXTROSE-NACL 20-5-0.45 MEQ/L-%-% IV SOLN
INTRAVENOUS | Status: DC
  Filled 2021-02-12 (×5): qty 1000

## 2021-02-12 MED ORDER — LORAZEPAM 2 MG/ML IJ SOLN
0.5000 mg | Freq: Every evening | INTRAMUSCULAR | Status: AC | PRN
  Administered 2021-02-12: 0.5 mg via INTRAVENOUS
  Filled 2021-02-12: qty 1

## 2021-02-12 NOTE — ED Notes (Addendum)
MD Jarvis Newcomer paged for systolic B/P maintained in 90's.  RN to give 500 cc bolus of LR.

## 2021-02-12 NOTE — Progress Notes (Addendum)
PROGRESS NOTE  Wendy Cooper  TOI:[id number] DOB: 1971-03-16 DOA: 02/11/2021 PCP: Clemencia Course, PA-C   Brief Narrative: Wendy Cooper is a 50 y.o. female with a history of colitis, HTN, and anxiety who presented to the ED at [facility] initially 6/6 for severe abdominal pain. She left after 7 hours without being seen only to return the next day to [facility] for the same complaint. She was found to have leukocytosis (WBC 19.5k) and developed a fever to 101.68F after several hours in the waiting room and was taken for CT abd/pelvis which demonstrated sigmoid colon perforation with walled off 1.1 - 1.3 cm air collection. She was started on zosyn, IV fluids, IV analgesics, and surgery was consulted.   Assessment & Plan: Principal Problem:   Diverticulitis of colon with perforation Active Problems:   Essential hypertension   Leukocytosis   Anxiety   Tobacco use  Sepsis due to perforated sigmoid diverticulitis: First episode of diverticulitis, though is severe.  - Surgery following, keep NPO, will add IVF. No surgery currently planned, though pt understands and consents to procedure if failing medical management. - Continue Zosyn. Monit abdominal exam which shows severe tenderness without peritonitis at this time.  - Will need colonoscopy following this bout. Had polyps which [place], no GI established here.   HTN: Currently hypotensive. HR not rising, other vital signs stable. Lactic acid wnl. Verified LA to be normal.  - Hold antihypertensives.  - Limit sedating medications as much as reasonable.  - Rebolus LR this AM and continue monitoring.  Anxiety:  - Xanax qHS prn  Tobacco use:  - Nicotine patch - Cessation counseling to be provided prior to discharge.   Hypokalemia:  - Supplement in IVF  Obesity: Estimated body mass index is 34.97 kg/m as calculated from the following:   Height as of 02/10/21: 5\' 8"  (1.727 m).   Weight as of 02/10/21:  104.3 kg.  DVT prophylaxis: Lovenox 40mg  q24h (ok per surgery) Code Status: Full Family Communication: None at bedside Disposition Plan:  Status is: Inpatient  Remains inpatient appropriate because:Ongoing active pain requiring inpatient pain management and IV treatments appropriate due to intensity of illness or inability to take PO   Dispo: The patient is from: Home              Anticipated d/c is to: Home              Patient currently is not medically stable to d/c.   Difficult to place patient No  Consultants:   General surgery  Procedures:   TBD  Antimicrobials:  Zosyn 6/7 >>   Subjective: Pain is generalized throughout the lower/mid abdomen, constant, worse with any movement, severe, only down to 8/10 with IV dilaudid. Mild nausea, no vomiting. Had to hold belly up when ambulating, but is able to walk.   She presented to [facility] the day prior to admission, LWBS after 7 hours. Presented by EMS to ED yesterday, triaged to waiting room, then developed a fever about 6 hours later for which she was taken to CT scanner and only then did she get a room.   Objective: Vitals:   02/12/21 0245 02/12/21 0300 02/12/21 0609 02/12/21 0757  BP:  121/69 116/68 103/70  Pulse: 84 81 84 72  Resp:  17 20 19   Temp:    98.7 F (37.1 C)  TempSrc:    Oral  SpO2: (!) 86% 96% 96% 97%    Intake/Output Summary (  Last 24 hours) at 02/12/2021 0943 Last data filed at 02/12/2021 0916 Gross per 24 hour  Intake 50 ml  Output --  Net 50 ml   There were no vitals filed for this visit.  Gen: 50 y.o. female in no distress  Pulm: Non-labored breathing room air. Clear to auscultation bilaterally.  CV: Regular rate and rhythm. No murmur, rub, or gallop. No JVD, no pedal edema. GI: Abdomen soft, tender severely diffusely, not necessarily just in LLQ with +BS. No rebound or rigidity. No organomegaly or masses felt. Ext: Warm, no deformities Skin: No rashes, lesions or ulcers Neuro: Drowsy but  conversant, interactive, does not fall back to sleep at all, Oriented. No focal neurological deficits. Psych: Judgement and insight appear normal. Mood & affect appropriate.   Data Reviewed: I have personally reviewed following labs and imaging studies  CBC: Recent Labs  Lab 02/10/21 1816 02/11/21 1315 02/12/21 0357  WBC 15.6* 19.5* 17.0*  HGB 13.8 13.6 12.0  HCT 41.3 40.9 36.6  MCV 87.9 87.2 89.5  PLT 266 253 190   Basic Metabolic Panel: Recent Labs  Lab 02/10/21 1816 02/11/21 1315 02/12/21 0357  NA 134* 135 136  K 4.0 3.6 3.4*  CL 102 103 102  CO2 25 22 23   GLUCOSE 108* 107* 102*  BUN 17 13 9   CREATININE 0.76 0.80 0.74  CALCIUM 9.1 9.3 9.0   GFR: Estimated Creatinine Clearance: 106.4 mL/min (by C-G formula based on SCr of 0.74 mg/dL). Liver Function Tests: Recent Labs  Lab 02/10/21 1816 02/11/21 1315  AST 15 13*  ALT 13 11  ALKPHOS 67 62  BILITOT 0.4 0.9  PROT 7.4 7.1  ALBUMIN 4.0 3.5   Recent Labs  Lab 02/10/21 1816 02/11/21 1315  LIPASE 28 20   No results for input(s): AMMONIA in the last 168 hours. Coagulation Profile: No results for input(s): INR, PROTIME in the last 168 hours. Cardiac Enzymes: No results for input(s): CKTOTAL, CKMB, CKMBINDEX, TROPONINI in the last 168 hours. BNP (last 3 results) No results for input(s): PROBNP in the last 8760 hours. HbA1C: No results for input(s): HGBA1C in the last 72 hours. CBG: No results for input(s): GLUCAP in the last 168 hours. Lipid Profile: No results for input(s): CHOL, HDL, LDLCALC, TRIG, CHOLHDL, LDLDIRECT in the last 72 hours. Thyroid Function Tests: No results for input(s): TSH, T4TOTAL, FREET4, T3FREE, THYROIDAB in the last 72 hours. Anemia Panel: No results for input(s): VITAMINB12, FOLATE, FERRITIN, TIBC, IRON, RETICCTPCT in the last 72 hours. Urine analysis:    Component Value Date/Time   COLORURINE YELLOW 02/11/2021 1413   APPEARANCEUR HAZY (A) 02/11/2021 1413   LABSPEC 1.017  02/11/2021 1413   PHURINE 6.0 02/11/2021 1413   GLUCOSEU NEGATIVE 02/11/2021 1413   HGBUR SMALL (A) 02/11/2021 1413   BILIRUBINUR NEGATIVE 02/11/2021 1413   KETONESUR NEGATIVE 02/11/2021 1413   PROTEINUR NEGATIVE 02/11/2021 1413   NITRITE NEGATIVE 02/11/2021 1413   LEUKOCYTESUR TRACE (A) 02/11/2021 1413   Recent Results (from the past 240 hour(s))  Resp Panel by RT-PCR (Flu A&B, Covid) Nasopharyngeal Swab     Status: None   Collection Time: 02/11/21  9:26 PM   Specimen: Nasopharyngeal Swab; Nasopharyngeal(NP) swabs in vial transport medium  Result Value Ref Range Status   SARS Coronavirus 2 by RT PCR NEGATIVE NEGATIVE Final    Comment: (NOTE) SARS-CoV-2 target nucleic acids are NOT DETECTED.  The SARS-CoV-2 RNA is generally detectable in upper respiratory specimens during the acute phase of infection. The lowest  concentration of SARS-CoV-2 viral copies this assay can detect is 138 copies/mL. A negative result does not preclude SARS-Cov-2 infection and should not be used as the sole basis for treatment or other patient management decisions. A negative result may occur with  improper specimen collection/handling, submission of specimen other than nasopharyngeal swab, presence of viral mutation(s) within the areas targeted by this assay, and inadequate number of viral copies(<138 copies/mL). A negative result must be combined with clinical observations, patient history, and epidemiological information. The expected result is Negative.  Fact Sheet for Patients:  BloggerCourse.com  Fact Sheet for Healthcare Providers:  SeriousBroker.it  This test is no t yet approved or cleared by the [place] FDA and  has been authorized for detection and/or diagnosis of SARS-CoV-2 by FDA under an Emergency Use Authorization (EUA). This EUA will remain  in effect (meaning this test can be used) for the duration of the COVID-19 declaration  under Section 564(b)(1) of the Act, 21 U.S.C.section 360bbb-3(b)(1), unless the authorization is terminated  or revoked sooner.       Influenza A by PCR NEGATIVE NEGATIVE Final   Influenza B by PCR NEGATIVE NEGATIVE Final    Comment: (NOTE) The Xpert Xpress SARS-CoV-2/FLU/RSV plus assay is intended as an aid in the diagnosis of influenza from Nasopharyngeal swab specimens and should not be used as a sole basis for treatment. Nasal washings and aspirates are unacceptable for Xpert Xpress SARS-CoV-2/FLU/RSV testing.  Fact Sheet for Patients: BloggerCourse.com  Fact Sheet for Healthcare Providers: SeriousBroker.it  This test is not yet approved or cleared by the [place] FDA and has been authorized for detection and/or diagnosis of SARS-CoV-2 by FDA under an Emergency Use Authorization (EUA). This EUA will remain in effect (meaning this test can be used) for the duration of the COVID-19 declaration under Section 564(b)(1) of the Act, 21 U.S.C. section 360bbb-3(b)(1), unless the authorization is terminated or revoked.  Performed at [facility], [address], [place], [place] [address]       Radiology Studies: CT Abdomen Pelvis W Contrast  Result Date: 02/11/2021 CLINICAL DATA:  Lower abdominal pain. EXAM: CT ABDOMEN AND PELVIS WITH CONTRAST TECHNIQUE: Multidetector CT imaging of the abdomen and pelvis was performed using the standard protocol following bolus administration of intravenous contrast. CONTRAST:  OMNIPAQUE IOHEXOL 300 MG/ML  SOLN COMPARISON:  October 23, 2020 FINDINGS: Lower chest: Mild linear scarring and/or atelectasis is seen within the posterior aspect of the bilateral lung bases. Hepatobiliary: No focal liver abnormality is seen. No gallstones, gallbladder wall thickening, or biliary dilatation. Pancreas: Unremarkable. No pancreatic ductal dilatation or surrounding inflammatory changes. Spleen:  Normal in size without focal abnormality. Adrenals/Urinary Tract: Adrenal glands are unremarkable. 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 Bladder is unremarkable. Stomach/Bowel: There is a small hiatal hernia. Appendix appears normal. No evidence of bowel dilatation. Numerous diverticula are seen within a markedly thickened and inflamed mid to distal sigmoid colon. A segment of adjacent markedly thickened ileum is present. A 1.2 cm x 1.1 cm focus of contained free air is seen in between these inflamed bowel loops (axial CT image 71, CT series number 3). Vascular/Lymphatic: No significant vascular findings are present. No enlarged abdominal or pelvic lymph nodes. Reproductive: Uterus and bilateral adnexa are unremarkable. Other: No abdominal wall hernia or abnormality. No abdominopelvic ascites. Musculoskeletal: Bilateral metallic density pedicle screws are seen at the levels of L5 and S1. IMPRESSION:  1. Marked severity colitis/diverticulitis along the mid to distal sigmoid colon, with marked severity enteritis involving an adjacent loop of ileum. 2. Small focus of adjacent contained free air consistent with associated perforation. 3. Crossed fused ectopia, as described above. 4. Postoperative changes within the lower lumbar spine. Electronically Signed   By: Aram Candela M.D.   On: 02/11/2021 20:35    Scheduled Meds: . bisoprolol-hydrochlorothiazide  1 tablet Oral QHS  . hydrochlorothiazide  12.5 mg Oral Daily  .  HYDROmorphone (DILAUDID) injection  1 mg Intravenous Once  . lisinopril  20 mg Oral Daily  . nicotine  7 mg Transdermal Daily   Continuous Infusions: . lactated ringers 125 mL/hr at 02/12/21 0920  . methocarbamol (ROBAXIN) IV    . piperacillin-tazobactam (ZOSYN)  IV Stopped (02/12/21 0916)     LOS: 1 day   Time spent: 35 minutes.  Tyrone Nine, MD [facility] [email or web address] 02/12/2021, 9:43 AM

## 2021-02-12 NOTE — ED Notes (Signed)
Attempted report x1. 

## 2021-02-12 NOTE — Progress Notes (Signed)
Progress Note     Subjective: CC: Tired because of pain medications and still having abdominal pain - 8/10 from 10/10 with pain medications. No nausea.    Objective: Vital signs in last 24 hours: Temp:  [98 F (36.7 C)-101.2 F (38.4 C)] 98 F (36.7 C) (06/07 2124) Pulse Rate:  [80-107] 84 (06/08 0609) Resp:  [17-21] 20 (06/08 0609) BP: (112-135)/(62-90) 116/68 (06/08 0609) SpO2:  [86 %-100 %] 96 % (06/08 0609)    Intake/Output from previous day: No intake/output data recorded. Intake/Output this shift: No intake/output data recorded.  PE: General: pleasant, WD, female who is laying in bed in NAD HEENT: head is normocephalic, atraumatic.  Mouth is pink and moist Heart: regular, rate, and rhythm. Palpable radial and pedal pulses bilaterally Lungs: CTAB, no wheezes, rhonchi, or rales noted.  Respiratory effort nonlabored Abd: soft, ND, +BS. Diffuse moderate to severe tenderness to palpation with guarding with greatest severity in bilateral lower quadrants MS: all 4 extremities are symmetrical with no cyanosis, clubbing, or edema. Skin: warm and dry with no masses, lesions, or rashes Psych: A&Ox3 with an appropriate affect.    Lab Results:  Recent Labs    02/11/21 1315 02/12/21 0357  WBC 19.5* 17.0*  HGB 13.6 12.0  HCT 40.9 36.6  PLT 253 190   BMET Recent Labs    02/11/21 1315 02/12/21 0357  NA 135 136  K 3.6 3.4*  CL 103 102  CO2 22 23  GLUCOSE 107* 102*  BUN 13 9  CREATININE 0.80 0.74  CALCIUM 9.3 9.0   PT/INR No results for input(s): LABPROT, INR in the last 72 hours. CMP     Component Value Date/Time   NA 136 02/12/2021 0357   K 3.4 (L) 02/12/2021 0357   CL 102 02/12/2021 0357   CO2 23 02/12/2021 0357   GLUCOSE 102 (H) 02/12/2021 0357   BUN 9 02/12/2021 0357   CREATININE 0.74 02/12/2021 0357   CALCIUM 9.0 02/12/2021 0357   PROT 7.1 02/11/2021 1315   ALBUMIN 3.5 02/11/2021 1315   AST 13 (L) 02/11/2021 1315   ALT 11 02/11/2021 1315    ALKPHOS 62 02/11/2021 1315   BILITOT 0.9 02/11/2021 1315   GFRNONAA >60 02/12/2021 0357   Lipase     Component Value Date/Time   LIPASE 20 02/11/2021 1315       Studies/Results: CT Abdomen Pelvis W Contrast  Result Date: 02/11/2021 CLINICAL DATA:  Lower abdominal pain. EXAM: CT ABDOMEN AND PELVIS WITH CONTRAST TECHNIQUE: Multidetector CT imaging of the abdomen and pelvis was performed using the standard protocol following bolus administration of intravenous contrast. CONTRAST:  OMNIPAQUE IOHEXOL 300 MG/ML  SOLN COMPARISON:  October 23, 2020 FINDINGS: Lower chest: Mild linear scarring and/or atelectasis is seen within the posterior aspect of the bilateral lung bases. Hepatobiliary: No focal liver abnormality is seen. No gallstones, gallbladder wall thickening, or biliary dilatation. Pancreas: Unremarkable. No pancreatic ductal dilatation or surrounding inflammatory changes. Spleen: Normal in size without focal abnormality. Adrenals/Urinary Tract: Adrenal glands are unremarkable. 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 Bladder is unremarkable. Stomach/Bowel: There is a small hiatal hernia. Appendix appears normal. No evidence of bowel dilatation. Numerous diverticula are seen within a markedly thickened and inflamed mid to distal sigmoid colon. A segment of adjacent markedly thickened ileum is present. A 1.2 cm x 1.1 cm focus of contained free air is seen in between these inflamed bowel  loops (axial CT image 71, CT series number 3). Vascular/Lymphatic: No significant vascular findings are present. No enlarged abdominal or pelvic lymph nodes. Reproductive: Uterus and bilateral adnexa are unremarkable. Other: No abdominal wall hernia or abnormality. No abdominopelvic ascites. Musculoskeletal: Bilateral metallic density pedicle screws are seen at the levels of L5 and S1. IMPRESSION: 1. Marked severity colitis/diverticulitis along the mid  to distal sigmoid colon, with marked severity enteritis involving an adjacent loop of ileum. 2. Small focus of adjacent contained free air consistent with associated perforation. 3. Crossed fused ectopia, as described above. 4. Postoperative changes within the lower lumbar spine. Electronically Signed   By: Aram Candela M.D.   On: 02/11/2021 20:35    Anti-infectives: Anti-infectives (From admission, onward)   Start     Dose/Rate Route Frequency Ordered Stop   02/12/21 0600  piperacillin-tazobactam (ZOSYN) IVPB 3.375 g        3.375 g 12.5 mL/hr over 240 Minutes Intravenous Every 8 hours 02/11/21 2317     02/11/21 2315  piperacillin-tazobactam (ZOSYN) IVPB 3.375 g  Status:  Discontinued        3.375 g 12.5 mL/hr over 240 Minutes Intravenous Every 8 hours 02/11/21 2313 02/11/21 2317   02/11/21 2130  piperacillin-tazobactam (ZOSYN) IVPB 3.375 g        3.375 g 100 mL/hr over 30 Minutes Intravenous  Once 02/11/21 2129 02/11/21 2356       Assessment/Plan Perforated diverticulitis - moderate to severe abdominal pain, no nuasea - multimodal pain control - added robaxin - continue IV abx  - remain NPO - if she does not improve with medical management need to consider operative intervention and colostomy which she understands and is agreeable to  FEN: NPO, IVF ID: [email or web address] 6/7>> VTE: SCDs, okay for chemical prophylaxis from our standpoint  ~per TRH~ HTN Tobacco use anxiety    LOS: 1 day    Eric Form, [facility] 02/12/2021, 7:22 AM Please see Amion for pager number during day hours 7:00am-4:30pm

## 2021-02-13 LAB — CBC WITH DIFFERENTIAL/PLATELET
Abs Immature Granulocytes: 0.06 10*3/uL (ref 0.00–0.07)
Basophils Absolute: 0 10*3/uL (ref 0.0–0.1)
Basophils Relative: 0 %
Eosinophils Absolute: 0.3 10*3/uL (ref 0.0–0.5)
Eosinophils Relative: 2 %
HCT: 33 % — ABNORMAL LOW (ref 36.0–46.0)
Hemoglobin: 11 g/dL — ABNORMAL LOW (ref 12.0–15.0)
Immature Granulocytes: 1 %
Lymphocytes Relative: 19 %
Lymphs Abs: 2.1 10*3/uL (ref 0.7–4.0)
MCH: 29.3 pg (ref 26.0–34.0)
MCHC: 33.3 g/dL (ref 30.0–36.0)
MCV: 88 fL (ref 80.0–100.0)
Monocytes Absolute: 0.6 10*3/uL (ref 0.1–1.0)
Monocytes Relative: 6 %
Neutro Abs: 8.3 10*3/uL — ABNORMAL HIGH (ref 1.7–7.7)
Neutrophils Relative %: 72 %
Platelets: 208 10*3/uL (ref 150–400)
RBC: 3.75 MIL/uL — ABNORMAL LOW (ref 3.87–5.11)
RDW: 13.1 % (ref 11.5–15.5)
WBC: 11.4 10*3/uL — ABNORMAL HIGH (ref 4.0–10.5)
nRBC: 0 % (ref 0.0–0.2)

## 2021-02-13 MED ORDER — HYDROMORPHONE HCL 1 MG/ML IJ SOLN
0.5000 mg | INTRAMUSCULAR | Status: DC | PRN
  Administered 2021-02-13 (×2): 0.5 mg via INTRAVENOUS
  Filled 2021-02-13 (×2): qty 1

## 2021-02-13 MED ORDER — SODIUM CHLORIDE 0.9 % IV BOLUS
1000.0000 mL | Freq: Once | INTRAVENOUS | Status: AC
  Administered 2021-02-13: 1000 mL via INTRAVENOUS

## 2021-02-13 MED ORDER — MIRTAZAPINE 30 MG PO TBDP
30.0000 mg | ORAL_TABLET | Freq: Every day | ORAL | Status: DC
  Administered 2021-02-13 – 2021-02-19 (×7): 30 mg via ORAL
  Filled 2021-02-13 (×8): qty 1

## 2021-02-13 MED ORDER — HYDROMORPHONE HCL 1 MG/ML IJ SOLN
1.0000 mg | INTRAMUSCULAR | Status: DC | PRN
  Administered 2021-02-13 – 2021-02-14 (×11): 1 mg via INTRAVENOUS
  Filled 2021-02-13 (×11): qty 1

## 2021-02-13 MED ORDER — VALACYCLOVIR HCL 500 MG PO TABS
500.0000 mg | ORAL_TABLET | Freq: Every day | ORAL | Status: AC
  Administered 2021-02-13 – 2021-02-15 (×3): 500 mg via ORAL
  Filled 2021-02-13 (×4): qty 1

## 2021-02-13 NOTE — Progress Notes (Signed)
Progress Note     Subjective: CC: Pt still with some pain   Objective: Vital signs in last 24 hours: Temp:  [98.1 F (36.7 C)-99.3 F (37.4 C)] 98.3 F (36.8 C) (06/09 0525) Pulse Rate:  [70-77] 74 (06/09 0525) Resp:  [16-19] 18 (06/09 0525) BP: (90-120)/(46-79) 116/66 (06/09 0525) SpO2:  [92 %-100 %] 94 % (06/09 0525)    Intake/Output from previous day: 06/08 0701 - 06/09 0700 In: 335 [I.V.:200; IV Piggyback:135] Out: -  Intake/Output this shift: No intake/output data recorded.  PE: General: pleasant, WD, female who is laying in bed in NAD HEENT: head is normocephalic, atraumatic.  Mouth is pink and moist Heart: regular, rate, and rhythm. Palpable radial and pedal pulses bilaterally Lungs: CTAB, no wheezes, rhonchi, or rales noted.  Respiratory effort nonlabored Abd: soft, ND, +BS. Diffuse moderate to severe tenderness to palpation with guarding with greatest severity in bilateral lower quadrants MS: all 4 extremities are symmetrical with no cyanosis, clubbing, or edema. Skin: warm and dry with no masses, lesions, or rashes Psych: A&Ox3 with an appropriate affect.    Lab Results:  Recent Labs    02/12/21 0357 02/13/21 0800  WBC 17.0* 11.4*  HGB 12.0 11.0*  HCT 36.6 33.0*  PLT 190 208    BMET Recent Labs    02/11/21 1315 02/12/21 0357  NA 135 136  K 3.6 3.4*  CL 103 102  CO2 22 23  GLUCOSE 107* 102*  BUN 13 9  CREATININE 0.80 0.74  CALCIUM 9.3 9.0    PT/INR No results for input(s): LABPROT, INR in the last 72 hours. CMP     Component Value Date/Time   NA 136 02/12/2021 0357   K 3.4 (L) 02/12/2021 0357   CL 102 02/12/2021 0357   CO2 23 02/12/2021 0357   GLUCOSE 102 (H) 02/12/2021 0357   BUN 9 02/12/2021 0357   CREATININE 0.74 02/12/2021 0357   CALCIUM 9.0 02/12/2021 0357   PROT 7.1 02/11/2021 1315   ALBUMIN 3.5 02/11/2021 1315   AST 13 (L) 02/11/2021 1315   ALT 11 02/11/2021 1315   ALKPHOS 62 02/11/2021 1315   BILITOT 0.9 02/11/2021  1315   GFRNONAA >60 02/12/2021 0357   Lipase     Component Value Date/Time   LIPASE 20 02/11/2021 1315       Studies/Results: CT Abdomen Pelvis W Contrast  Result Date: 02/11/2021 CLINICAL DATA:  Lower abdominal pain. EXAM: CT ABDOMEN AND PELVIS WITH CONTRAST TECHNIQUE: Multidetector CT imaging of the abdomen and pelvis was performed using the standard protocol following bolus administration of intravenous contrast. CONTRAST:  OMNIPAQUE IOHEXOL 300 MG/ML  SOLN COMPARISON:  October 23, 2020 FINDINGS: Lower chest: Mild linear scarring and/or atelectasis is seen within the posterior aspect of the bilateral lung bases. Hepatobiliary: No focal liver abnormality is seen. No gallstones, gallbladder wall thickening, or biliary dilatation. Pancreas: Unremarkable. No pancreatic ductal dilatation or surrounding inflammatory changes. Spleen: Normal in size without focal abnormality. Adrenals/Urinary Tract: Adrenal glands are unremarkable. 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 Bladder is unremarkable. Stomach/Bowel: There is a small hiatal hernia. Appendix appears normal. No evidence of bowel dilatation. Numerous diverticula are seen within a markedly thickened and inflamed mid to distal sigmoid colon. A segment of adjacent markedly thickened ileum is present. A 1.2 cm x 1.1 cm focus of contained free air is seen in between these inflamed bowel loops (axial CT image  71, CT series number 3). Vascular/Lymphatic: No significant vascular findings are present. No enlarged abdominal or pelvic lymph nodes. Reproductive: Uterus and bilateral adnexa are unremarkable. Other: No abdominal wall hernia or abnormality. No abdominopelvic ascites. Musculoskeletal: Bilateral metallic density pedicle screws are seen at the levels of L5 and S1. IMPRESSION: 1. Marked severity colitis/diverticulitis along the mid to distal sigmoid colon, with marked severity  enteritis involving an adjacent loop of ileum. 2. Small focus of adjacent contained free air consistent with associated perforation. 3. Crossed fused ectopia, as described above. 4. Postoperative changes within the lower lumbar spine. Electronically Signed   By: Aram Candela M.D.   On: 02/11/2021 20:35     Anti-infectives: Anti-infectives (From admission, onward)    Start     Dose/Rate Route Frequency Ordered Stop   02/12/21 0600  piperacillin-tazobactam (ZOSYN) IVPB 3.375 g        3.375 g 12.5 mL/hr over 240 Minutes Intravenous Every 8 hours 02/11/21 2317     02/11/21 2315  piperacillin-tazobactam (ZOSYN) IVPB 3.375 g  Status:  Discontinued        3.375 g 12.5 mL/hr over 240 Minutes Intravenous Every 8 hours 02/11/21 2313 02/11/21 2317   02/11/21 2130  piperacillin-tazobactam (ZOSYN) IVPB 3.375 g        3.375 g 100 mL/hr over 30 Minutes Intravenous  Once 02/11/21 2129 02/11/21 2356        Assessment/Plan Perforated diverticulitis - moderate to severe abdominal pain, no nuasea - multimodal pain control, increase pain Rx - added robaxin - continue IV abx  - remain NPO - if she does not improve with medical management need to consider operative intervention and colostomy which she understands and is agreeable to surgery if not better by Fri  FEN: NPO, IVF ID: [email or web address] 6/7>> VTE: SCDs, okay for chemical prophylaxis from our standpoint  ~per TRH~ HTN Tobacco use anxiety    LOS: 2 days    Eric Form, [facility] 02/13/2021, 9:19 AM Please see Amion for pager number during day hours 7:00am-4:30pm

## 2021-02-13 NOTE — Progress Notes (Addendum)
PROGRESS NOTE  Wendy Cooper  ZOX:[id number] DOB: 09-19-1970 DOA: 02/11/2021 PCP: Clemencia Course, PA-C   Brief Narrative: Wendy Cooper is a 51 y.o. female with a history of colitis, HTN, and anxiety who presented to the ED at [facility] initially 6/6 for severe abdominal pain. She left after 7 hours without being seen only to return the next day to [facility] for the same complaint. She was found to have leukocytosis (WBC 19.5k) and developed a fever to 101.27F after several hours in the waiting room and was taken for CT abd/pelvis which demonstrated sigmoid colon perforation with walled off 1.1 - 1.3 cm air collection. She was started on zosyn, IV fluids, IV analgesics, and surgery was consulted.   Assessment & Plan: Principal Problem:   Diverticulitis of colon with perforation Active Problems:   Essential hypertension   Leukocytosis   Anxiety   Tobacco use  Sepsis due to perforated sigmoid diverticulitis: First episode of diverticulitis, though is severe. No improvement thus far clinically, though WBC down. - Surgery following, keep strict NPO on IVF. Considering operative management 6/10 depending on clinical course. - Continue Zosyn. Monit abdominal exam which shows severe tenderness without peritonitis at this time.  - Will need colonoscopy following this bout. Had polyps which [place], no GI established here.   HTN: Currently hypotensive. HR not rising, other vital signs stable. Lactic acid wnl. Verified LA to be normal.  - Holding antihypertensives.  - Limit sedating medications as much as reasonable.  - Rebolused last night, continue IVF now.  Anxiety:  - Xanax changed to ativan as pt is NPO and clearly anxious though we wish to limit medications that could contribute to hypotension. Will continue monitoring closely.   Tobacco use:  - Nicotine patch - Cessation counseling to be provided prior to discharge.   Hypokalemia:  - Continue to  supplement in IVF, recheck in AM  Insomnia:  - Restart remeron (disintegrating tab)  Obesity: Estimated body mass index is 34.97 kg/m as calculated from the following:   Height as of 02/10/21: 5\' 8"  (1.727 m).   Weight as of 02/10/21: 104.3 kg.  DVT prophylaxis: Lovenox 40mg  q24h (ok per surgery) Code Status: Full Family Communication: None at bedside Disposition Plan:  Status is: Inpatient  Remains inpatient appropriate because:Ongoing active pain requiring inpatient pain management and IV treatments appropriate due to intensity of illness or inability to take PO   Dispo: The patient is from: Home              Anticipated d/c is to: Home              Patient currently is not medically stable to d/c.   Difficult to place patient No  Consultants:  General surgery  Procedures:  TBD  Antimicrobials: Zosyn 6/7 >>   Subjective: Pain is severe, constant, waxing/waning, improved with dilaudid 1mg  but wears off. Had nonbloody BM this AM that elicited abdominal pain. Very anxious and hungry.  Objective: Vitals:   02/12/21 2247 02/13/21 0110 02/13/21 0200 02/13/21 0525  BP: (!) 92/46 113/79 120/75 116/66  Pulse: 76 75 75 74  Resp: 19  18 18   Temp: 98.7 F (37.1 C)  98.8 F (37.1 C) 98.3 F (36.8 C)  TempSrc: Oral  Oral Oral  SpO2: 96%  92% 94%    Intake/Output Summary (Last 24 hours) at 02/13/2021 1223 Last data filed at 02/13/2021 1205 Gross per 24 hour  Intake 85 ml  Output --  Net 85 ml   Gen: 50 y.o. female in no distress, but evident discomfort Pulm: Nonlabored breathing room air. Clear. CV: Regular rate and rhythm. No murmur, rub, or gallop. No JVD, no dependent edema. GI: Abdomen soft but significantly diffusely tender with guarding mostly in lower abdomen, +BS.   Ext: Warm, no deformities Skin: No new rashes, lesions or ulcers on visualized skin. Neuro: Alert and oriented. No focal neurological deficits. Psych: Judgement and insight appear fair. No current AVH,  labile mood with anxiety. Affect congruent. No internal stimuli evident.    Data Reviewed: I have personally reviewed following labs and imaging studies  CBC: Recent Labs  Lab 02/10/21 1816 02/11/21 1315 02/12/21 0357 02/13/21 0800  WBC 15.6* 19.5* 17.0* 11.4*  NEUTROABS  --   --   --  8.3*  HGB 13.8 13.6 12.0 11.0*  HCT 41.3 40.9 36.6 33.0*  MCV 87.9 87.2 89.5 88.0  PLT 266 253 190 208   Basic Metabolic Panel: Recent Labs  Lab 02/10/21 1816 02/11/21 1315 02/12/21 0357  NA 134* 135 136  K 4.0 3.6 3.4*  CL 102 103 102  CO2 25 22 23   GLUCOSE 108* 107* 102*  BUN 17 13 9   CREATININE 0.76 0.80 0.74  CALCIUM 9.1 9.3 9.0   GFR: Estimated Creatinine Clearance: 106.4 mL/min (by C-G formula based on SCr of 0.74 mg/dL). Liver Function Tests: Recent Labs  Lab 02/10/21 1816 02/11/21 1315  AST 15 13*  ALT 13 11  ALKPHOS 67 62  BILITOT 0.4 0.9  PROT 7.4 7.1  ALBUMIN 4.0 3.5   Recent Labs  Lab 02/10/21 1816 02/11/21 1315  LIPASE 28 20   Urine analysis:    Component Value Date/Time   COLORURINE YELLOW 02/11/2021 1413   APPEARANCEUR HAZY (A) 02/11/2021 1413   LABSPEC 1.017 02/11/2021 1413   PHURINE 6.0 02/11/2021 1413   GLUCOSEU NEGATIVE 02/11/2021 1413   HGBUR SMALL (A) 02/11/2021 1413   BILIRUBINUR NEGATIVE 02/11/2021 1413   KETONESUR NEGATIVE 02/11/2021 1413   PROTEINUR NEGATIVE 02/11/2021 1413   NITRITE NEGATIVE 02/11/2021 1413   LEUKOCYTESUR TRACE (A) 02/11/2021 1413   Recent Results (from the past 240 hour(s))  Resp Panel by RT-PCR (Flu A&B, Covid) Nasopharyngeal Swab     Status: None   Collection Time: 02/11/21  9:26 PM   Specimen: Nasopharyngeal Swab; Nasopharyngeal(NP) swabs in vial transport medium  Result Value Ref Range Status   SARS Coronavirus 2 by RT PCR NEGATIVE NEGATIVE Final    Comment: (NOTE) SARS-CoV-2 target nucleic acids are NOT DETECTED.  The SARS-CoV-2 RNA is generally detectable in upper respiratory specimens during the acute phase  of infection. The lowest concentration of SARS-CoV-2 viral copies this assay can detect is 138 copies/mL. A negative result does not preclude SARS-Cov-2 infection and should not be used as the sole basis for treatment or other patient management decisions. A negative result may occur with  improper specimen collection/handling, submission of specimen other than nasopharyngeal swab, presence of viral mutation(s) within the areas targeted by this assay, and inadequate number of viral copies(<138 copies/mL). A negative result must be combined with clinical observations, patient history, and epidemiological information. The expected result is Negative.  Fact Sheet for Patients:  04/13/2021  Fact Sheet for Healthcare Providers:  04/13/21  This test is no t yet approved or cleared by the [employer] FDA and  has been authorized for detection and/or diagnosis of SARS-CoV-2 by FDA under an Emergency Use Authorization (EUA). This EUA will remain  in effect (meaning this test can be used) for the duration of the COVID-19 declaration under Section 564(b)(1) of the Act, 21 U.S.C.section 360bbb-3(b)(1), unless the authorization is terminated  or revoked sooner.       Influenza A by PCR NEGATIVE NEGATIVE Final   Influenza B by PCR NEGATIVE NEGATIVE Final    Comment: (NOTE) The Xpert Xpress SARS-CoV-2/FLU/RSV plus assay is intended as an aid in the diagnosis of influenza from Nasopharyngeal swab specimens and should not be used as a sole basis for treatment. Nasal washings and aspirates are unacceptable for Xpert Xpress SARS-CoV-2/FLU/RSV testing.  Fact Sheet for Patients: [employer]comhttps://www.fda.gov/media/152166/download  Fact Sheet for Healthcare Providers: Serious[employer]ker.ithttps://www.fda.gov/media/152162/download  This test is not yet approved or cleared by the [place] FDA and has been authorized for detection and/or diagnosis of  SARS-CoV-2 by FDA under an Emergency Use Authorization (EUA). This EUA will remain in effect (meaning this test can be used) for the duration of the COVID-19 declaration under Section 564(b)(1) of the Act, 21 U.S.C. section 360bbb-3(b)(1), unless the authorization is terminated or revoked.  Performed at [facility], [address] [place], [place] [address]       Radiology Studies: CT Abdomen Pelvis W Contrast  Result Date: 02/11/2021 CLINICAL DATA:  Lower abdominal pain. EXAM: CT ABDOMEN AND PELVIS WITH CONTRAST TECHNIQUE: Multidetector CT imaging of the abdomen and pelvis was performed using the standard protocol following bolus administration of intravenous contrast. CONTRAST:  100mL OMNIPAQUE IOHEXOL 300 MG/ML  SOLN COMPARISON:  October 23, 2020 FINDINGS: Lower chest: Mild linear scarring and/or atelectasis is seen within the posterior aspect of the bilateral lung bases. Hepatobiliary: No focal liver abnormality is seen. No gallstones, gallbladder wall thickening, or biliary dilatation. Pancreas: Unremarkable. No pancreatic ductal dilatation or surrounding inflammatory changes. Spleen: Normal in size without focal abnormality. Adrenals/Urinary Tract: Adrenal glands are unremarkable. 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 Bladder is unremarkable. Stomach/Bowel: There is a small hiatal hernia. Appendix appears normal. No evidence of bowel dilatation. Numerous diverticula are seen within a markedly thickened and inflamed mid to distal sigmoid colon. A segment of adjacent markedly thickened ileum is present. A 1.2 cm x 1.1 cm focus of contained free air is seen in between these inflamed bowel loops (axial CT image 71, CT series number 3). Vascular/Lymphatic: No significant vascular findings are present. No enlarged abdominal or pelvic lymph nodes. Reproductive: Uterus and bilateral adnexa are unremarkable. Other: No abdominal wall  hernia or abnormality. No abdominopelvic ascites. Musculoskeletal: Bilateral metallic density pedicle screws are seen at the levels of L5 and S1. IMPRESSION: 1. Marked severity colitis/diverticulitis along the mid to distal sigmoid colon, with marked severity enteritis involving an adjacent loop of ileum. 2. Small focus of adjacent contained free air consistent with associated perforation. 3. Crossed fused ectopia, as described above. 4. Postoperative changes within the lower lumbar spine. Electronically Signed   By: Aram Candelahaddeus  Houston M.D.   On: 02/11/2021 20:35     Scheduled Meds:  enoxaparin (LOVENOX) injection  40 mg Subcutaneous Q24H   nicotine  7 mg Transdermal Daily   valACYclovir  500 mg Oral Daily   Continuous Infusions:  dextrose 5 % and 0.45 % NaCl with KCl 20 mEq/L 100 mL/hr at 02/13/21 0950   methocarbamol (ROBAXIN) IV 500 mg (02/12/21 1200)   piperacillin-tazobactam (ZOSYN)  IV 3.375 g (02/13/21 0514)     LOS: 2 days   Time spent:  35 minutes.  Tyrone Nine, MD [facility] [email or web address] 02/13/2021, 12:23 PM

## 2021-02-14 ENCOUNTER — Inpatient Hospital Stay (HOSPITAL_COMMUNITY): Payer: BLUE CROSS/BLUE SHIELD | Admitting: Certified Registered Nurse Anesthetist

## 2021-02-14 ENCOUNTER — Encounter (HOSPITAL_COMMUNITY)
Admission: EM | Disposition: A | Discharge status: Home / Self Care | Payer: Self-pay | Source: Home / Self Care | Attending: Family Medicine

## 2021-02-14 ENCOUNTER — Encounter (HOSPITAL_COMMUNITY): Payer: Self-pay | Admitting: Family Medicine

## 2021-02-14 HISTORY — PX: PARTIAL COLECTOMY: SHX5273

## 2021-02-14 HISTORY — PX: LAPAROTOMY: SHX154

## 2021-02-14 LAB — CBC WITH DIFFERENTIAL/PLATELET
Abs Immature Granulocytes: 0.06 10*3/uL (ref 0.00–0.07)
Basophils Absolute: 0 10*3/uL (ref 0.0–0.1)
Basophils Relative: 0 %
Eosinophils Absolute: 0.4 10*3/uL (ref 0.0–0.5)
Eosinophils Relative: 4 %
HCT: 33 % — ABNORMAL LOW (ref 36.0–46.0)
Hemoglobin: 11 g/dL — ABNORMAL LOW (ref 12.0–15.0)
Immature Granulocytes: 1 %
Lymphocytes Relative: 29 %
Lymphs Abs: 2.6 10*3/uL (ref 0.7–4.0)
MCH: 29.6 pg (ref 26.0–34.0)
MCHC: 33.3 g/dL (ref 30.0–36.0)
MCV: 88.7 fL (ref 80.0–100.0)
Monocytes Absolute: 0.6 10*3/uL (ref 0.1–1.0)
Monocytes Relative: 7 %
Neutro Abs: 5.4 10*3/uL (ref 1.7–7.7)
Neutrophils Relative %: 59 %
Platelets: 232 10*3/uL (ref 150–400)
RBC: 3.72 MIL/uL — ABNORMAL LOW (ref 3.87–5.11)
RDW: 13.1 % (ref 11.5–15.5)
WBC: 9.1 10*3/uL (ref 4.0–10.5)
nRBC: 0 % (ref 0.0–0.2)

## 2021-02-14 LAB — COMPREHENSIVE METABOLIC PANEL
ALT: 13 U/L (ref 0–44)
AST: 14 U/L — ABNORMAL LOW (ref 15–41)
Albumin: 2.6 g/dL — ABNORMAL LOW (ref 3.5–5.0)
Alkaline Phosphatase: 56 U/L (ref 38–126)
Anion gap: 7 (ref 5–15)
BUN: 5 mg/dL — ABNORMAL LOW (ref 6–20)
CO2: 27 mmol/L (ref 22–32)
Calcium: 8.8 mg/dL — ABNORMAL LOW (ref 8.9–10.3)
Chloride: 106 mmol/L (ref 98–111)
Creatinine, Ser: 0.84 mg/dL (ref 0.44–1.00)
GFR, Estimated: >60 mL/min (ref >60)
Glucose, Bld: 95 mg/dL (ref 70–99)
Potassium: 3.3 mmol/L — ABNORMAL LOW (ref 3.5–5.1)
Sodium: 140 mmol/L (ref 135–145)
Total Bilirubin: 0.6 mg/dL (ref 0.3–1.2)
Total Protein: 5.8 g/dL — ABNORMAL LOW (ref 6.5–8.1)

## 2021-02-14 LAB — ABO/RH: ABO/RH(D): B POS

## 2021-02-14 LAB — TYPE AND SCREEN
ABO/RH(D): B POS
Antibody Screen: NEGATIVE

## 2021-02-14 SURGERY — LAPAROTOMY, EXPLORATORY
Anesthesia: General

## 2021-02-14 SURGERY — LAPAROTOMY, EXPLORATORY
Anesthesia: General | Site: Abdomen

## 2021-02-14 MED ORDER — DIPHENHYDRAMINE HCL 50 MG/ML IJ SOLN: 12.5000 mg | Freq: Four times a day (QID) | INTRAMUSCULAR | Status: DC | PRN

## 2021-02-14 MED ORDER — OXYCODONE HCL 5 MG/5ML PO SOLN
5.0000 mg | Freq: Once | ORAL | Status: DC | PRN
Start: 2021-02-14 — End: 2021-02-14

## 2021-02-14 MED ORDER — ONDANSETRON HCL 4 MG/2ML IJ SOLN
INTRAMUSCULAR | Status: DC | PRN
  Administered 2021-02-14: 4 mg via INTRAVENOUS

## 2021-02-14 MED ORDER — LIDOCAINE HCL (PF) 2 % IJ SOLN
INTRAMUSCULAR | Status: AC
  Filled 2021-02-14: qty 5

## 2021-02-14 MED ORDER — SUGAMMADEX SODIUM 200 MG/2ML IV SOLN
INTRAVENOUS | Status: DC | PRN
  Administered 2021-02-14: 200 mg via INTRAVENOUS

## 2021-02-14 MED ORDER — HYDROMORPHONE 1 MG/ML IV SOLN
INTRAVENOUS | Status: DC
  Administered 2021-02-14: 3.6 mg via INTRAVENOUS
  Administered 2021-02-15: 6.9 mg via INTRAVENOUS
  Administered 2021-02-15: 0.9 mg via INTRAVENOUS
  Administered 2021-02-15: 1.5 mg via INTRAVENOUS
  Administered 2021-02-16: 0 mg via INTRAVENOUS
  Administered 2021-02-16: 1 mg via INTRAVENOUS
  Administered 2021-02-16: 1.7 mg via INTRAVENOUS
  Administered 2021-02-16: 1.8 mg via INTRAVENOUS
  Administered 2021-02-16: 2 mg via INTRAVENOUS
  Administered 2021-02-16: 1.87 mg via INTRAVENOUS
  Administered 2021-02-17: 1.8 mg via INTRAVENOUS
  Administered 2021-02-17: 0.6 mg via INTRAVENOUS
  Administered 2021-02-17 – 2021-02-18 (×2): 0.9 mg via INTRAVENOUS
  Administered 2021-02-18: 0.6 mg via INTRAVENOUS
  Administered 2021-02-18: 0.9 mg via INTRAVENOUS
  Administered 2021-02-18: 0.6 mg via INTRAVENOUS
  Filled 2021-02-14: qty 30

## 2021-02-14 MED ORDER — CHLORHEXIDINE GLUCONATE 0.12 % MT SOLN
15.0000 mL | Freq: Once | OROMUCOSAL | Status: AC
  Administered 2021-02-14: 15 mL via OROMUCOSAL
  Filled 2021-02-14: qty 15

## 2021-02-14 MED ORDER — LORAZEPAM 2 MG/ML IJ SOLN
0.5000 mg | Freq: Four times a day (QID) | INTRAMUSCULAR | Status: DC | PRN
  Administered 2021-02-14 – 2021-02-18 (×8): 1 mg via INTRAVENOUS
  Filled 2021-02-14 (×8): qty 1

## 2021-02-14 MED ORDER — FENTANYL CITRATE (PF) 250 MCG/5ML IJ SOLN
INTRAMUSCULAR | Status: AC
  Filled 2021-02-14: qty 5

## 2021-02-14 MED ORDER — DEXMEDETOMIDINE (PRECEDEX) IN NS 20 MCG/5ML (4 MCG/ML) IV SYRINGE
PREFILLED_SYRINGE | INTRAVENOUS | Status: AC
  Filled 2021-02-14: qty 5

## 2021-02-14 MED ORDER — PROMETHAZINE HCL 25 MG/ML IJ SOLN: 6.2500 mg | INTRAMUSCULAR | Status: DC | PRN

## 2021-02-14 MED ORDER — HYDROMORPHONE HCL 1 MG/ML IJ SOLN
INTRAMUSCULAR | Status: AC
  Administered 2021-02-14: 0.5 mg via INTRAVENOUS
  Filled 2021-02-14: qty 1

## 2021-02-14 MED ORDER — LACTATED RINGERS IV SOLN: INTRAVENOUS | Status: DC

## 2021-02-14 MED ORDER — ROCURONIUM BROMIDE 10 MG/ML (PF) SYRINGE
PREFILLED_SYRINGE | INTRAVENOUS | Status: DC | PRN
  Administered 2021-02-14: 100 mg via INTRAVENOUS

## 2021-02-14 MED ORDER — NALOXONE HCL 0.4 MG/ML IJ SOLN: 0.4000 mg | INTRAMUSCULAR | Status: DC | PRN

## 2021-02-14 MED ORDER — DEXMEDETOMIDINE (PRECEDEX) IN NS 20 MCG/5ML (4 MCG/ML) IV SYRINGE
PREFILLED_SYRINGE | INTRAVENOUS | Status: DC | PRN
  Administered 2021-02-14 (×2): 8 ug via INTRAVENOUS
  Administered 2021-02-14: 4 ug via INTRAVENOUS

## 2021-02-14 MED ORDER — DEXAMETHASONE SODIUM PHOSPHATE 10 MG/ML IJ SOLN
INTRAMUSCULAR | Status: DC | PRN
  Administered 2021-02-14: 10 mg via INTRAVENOUS

## 2021-02-14 MED ORDER — ENOXAPARIN SODIUM 40 MG/0.4ML IJ SOSY
40.0000 mg | PREFILLED_SYRINGE | INTRAMUSCULAR | Status: DC
  Administered 2021-02-15 – 2021-02-20 (×6): 40 mg via SUBCUTANEOUS
  Filled 2021-02-14 (×6): qty 0.4

## 2021-02-14 MED ORDER — SODIUM CHLORIDE 0.9% FLUSH: 9.0000 mL | INTRAVENOUS | Status: DC | PRN

## 2021-02-14 MED ORDER — AMISULPRIDE (ANTIEMETIC) 5 MG/2ML IV SOLN
10.0000 mg | Freq: Once | INTRAVENOUS | Status: DC | PRN
Start: 2021-02-14 — End: 2021-02-14

## 2021-02-14 MED ORDER — FENTANYL CITRATE (PF) 250 MCG/5ML IJ SOLN
INTRAMUSCULAR | Status: DC | PRN
  Administered 2021-02-14: 100 ug via INTRAVENOUS
  Administered 2021-02-14 (×6): 50 ug via INTRAVENOUS

## 2021-02-14 MED ORDER — OXYCODONE HCL 5 MG PO TABS: 5.0000 mg | ORAL_TABLET | Freq: Once | ORAL | Status: DC | PRN

## 2021-02-14 MED ORDER — KCL IN DEXTROSE-NACL 40-5-0.45 MEQ/L-%-% IV SOLN
INTRAVENOUS | Status: DC
  Filled 2021-02-14 (×2): qty 1000

## 2021-02-14 MED ORDER — ORAL CARE MOUTH RINSE: 15.0000 mL | Freq: Once | OROMUCOSAL | Status: AC

## 2021-02-14 MED ORDER — HYDROMORPHONE HCL 1 MG/ML IJ SOLN
0.2500 mg | INTRAMUSCULAR | Status: DC | PRN
Start: 2021-02-14 — End: 2021-02-14
  Administered 2021-02-14: 0.5 mg via INTRAVENOUS

## 2021-02-14 MED ORDER — ONDANSETRON HCL 4 MG/2ML IJ SOLN: 4.0000 mg | Freq: Four times a day (QID) | INTRAMUSCULAR | Status: DC | PRN

## 2021-02-14 MED ORDER — ROCURONIUM BROMIDE 10 MG/ML (PF) SYRINGE
PREFILLED_SYRINGE | INTRAVENOUS | Status: AC
  Filled 2021-02-14: qty 10

## 2021-02-14 MED ORDER — DEXAMETHASONE SODIUM PHOSPHATE 10 MG/ML IJ SOLN
INTRAMUSCULAR | Status: AC
  Filled 2021-02-14: qty 1

## 2021-02-14 MED ORDER — HYDROMORPHONE 1 MG/ML IV SOLN
INTRAVENOUS | Status: AC
  Administered 2021-02-14: 30 mg via INTRAVENOUS
  Filled 2021-02-14: qty 30

## 2021-02-14 MED ORDER — MIDAZOLAM HCL 2 MG/2ML IJ SOLN
INTRAMUSCULAR | Status: AC
  Filled 2021-02-14: qty 2

## 2021-02-14 MED ORDER — MIDAZOLAM HCL 2 MG/2ML IJ SOLN
INTRAMUSCULAR | Status: DC | PRN
  Administered 2021-02-14: 2 mg via INTRAVENOUS

## 2021-02-14 MED ORDER — DIPHENHYDRAMINE HCL 12.5 MG/5ML PO ELIX: 12.5000 mg | ORAL_SOLUTION | Freq: Four times a day (QID) | ORAL | Status: DC | PRN

## 2021-02-14 MED ORDER — CHLORHEXIDINE GLUCONATE CLOTH 2 % EX PADS
6.0000 | MEDICATED_PAD | Freq: Every day | CUTANEOUS | Status: DC
  Administered 2021-02-14 – 2021-02-18 (×3): 6 via TOPICAL

## 2021-02-14 MED ORDER — DULOXETINE HCL 20 MG PO CPEP
20.0000 mg | ORAL_CAPSULE | Freq: Every day | ORAL | Status: DC
  Administered 2021-02-14 – 2021-02-20 (×7): 20 mg via ORAL
  Filled 2021-02-14 (×7): qty 1

## 2021-02-14 MED ORDER — ONDANSETRON HCL 4 MG/2ML IJ SOLN
INTRAMUSCULAR | Status: AC
  Filled 2021-02-14: qty 2

## 2021-02-14 MED ORDER — PROPOFOL 10 MG/ML IV BOLUS
INTRAVENOUS | Status: DC | PRN
  Administered 2021-02-14: 160 mg via INTRAVENOUS

## 2021-02-14 MED ORDER — LIDOCAINE 2% (20 MG/ML) 5 ML SYRINGE
INTRAMUSCULAR | Status: DC | PRN
  Administered 2021-02-14: 100 mg via INTRAVENOUS

## 2021-02-14 MED ORDER — MEPERIDINE HCL 25 MG/ML IJ SOLN: 6.2500 mg | INTRAMUSCULAR | Status: DC | PRN

## 2021-02-14 MED ORDER — METHOCARBAMOL 1000 MG/10ML IJ SOLN
1000.0000 mg | Freq: Three times a day (TID) | INTRAVENOUS | Status: DC
  Administered 2021-02-14 – 2021-02-16 (×5): 1000 mg via INTRAVENOUS
  Filled 2021-02-14 (×7): qty 10

## 2021-02-14 MED ORDER — PROPOFOL 10 MG/ML IV BOLUS
INTRAVENOUS | Status: AC
  Filled 2021-02-14: qty 20

## 2021-02-14 MED ORDER — ACETAMINOPHEN 500 MG PO TABS
1000.0000 mg | ORAL_TABLET | Freq: Four times a day (QID) | ORAL | Status: DC
  Administered 2021-02-14 – 2021-02-16 (×5): 1000 mg via ORAL
  Filled 2021-02-14 (×8): qty 2

## 2021-02-14 MED ORDER — PHENYLEPHRINE 40 MCG/ML (10ML) SYRINGE FOR IV PUSH (FOR BLOOD PRESSURE SUPPORT)
PREFILLED_SYRINGE | INTRAVENOUS | Status: AC
  Filled 2021-02-14: qty 10

## 2021-02-14 MED ORDER — 0.9 % SODIUM CHLORIDE (POUR BTL) OPTIME
TOPICAL | Status: DC | PRN
  Administered 2021-02-14 (×2): 1000 mL

## 2021-02-14 SURGICAL SUPPLY — 53 items
BLADE CLIPPER SURG (BLADE) IMPLANT
CANISTER SUCT 3000ML PPV (MISCELLANEOUS) ×2 IMPLANT
CHLORAPREP W/TINT 26 (MISCELLANEOUS) ×2 IMPLANT
COVER SURGICAL LIGHT HANDLE (MISCELLANEOUS) ×2 IMPLANT
COVER WAND RF STERILE (DRAPES) ×2 IMPLANT
DRAPE LAPAROSCOPIC ABDOMINAL (DRAPES) ×2 IMPLANT
DRAPE WARM FLUID 44X44 (DRAPES) ×2 IMPLANT
DRSG OPSITE POSTOP 4X10 (GAUZE/BANDAGES/DRESSINGS) IMPLANT
DRSG OPSITE POSTOP 4X8 (GAUZE/BANDAGES/DRESSINGS) ×2 IMPLANT
ELECT BLADE 6.5 EXT (BLADE) IMPLANT
ELECT CAUTERY BLADE 6.4 (BLADE) ×2 IMPLANT
ELECT REM PT RETURN 9FT ADLT (ELECTROSURGICAL) ×2
ELECTRODE REM PT RTRN 9FT ADLT (ELECTROSURGICAL) ×1 IMPLANT
GLOVE BIO SURGEON STRL SZ7.5 (GLOVE) ×4 IMPLANT
GLOVE SRG 8 PF TXTR STRL LF DI (GLOVE) ×2 IMPLANT
GLOVE SURG UNDER POLY LF SZ8 (GLOVE) ×2
GOWN STRL REUS W/ TWL LRG LVL3 (GOWN DISPOSABLE) ×4 IMPLANT
GOWN STRL REUS W/ TWL XL LVL3 (GOWN DISPOSABLE) ×2 IMPLANT
GOWN STRL REUS W/TWL LRG LVL3 (GOWN DISPOSABLE) ×4
GOWN STRL REUS W/TWL XL LVL3 (GOWN DISPOSABLE) ×2
HANDLE SUCTION POOLE (INSTRUMENTS) ×1 IMPLANT
KIT BASIN OR (CUSTOM PROCEDURE TRAY) ×2 IMPLANT
KIT OSTOMY DRAINABLE 2.75 STR (WOUND CARE) ×2 IMPLANT
KIT SIGMOIDOSCOPE (SET/KITS/TRAYS/PACK) IMPLANT
KIT TURNOVER KIT B (KITS) ×2 IMPLANT
LIGASURE IMPACT 36 18CM CVD LR (INSTRUMENTS) IMPLANT
NS IRRIG 1000ML POUR BTL (IV SOLUTION) ×4 IMPLANT
PACK COLON (CUSTOM PROCEDURE TRAY) ×2 IMPLANT
PACK GENERAL/GYN (CUSTOM PROCEDURE TRAY) ×2 IMPLANT
PAD ARMBOARD 7.5X6 YLW CONV (MISCELLANEOUS) ×2 IMPLANT
PENCIL SMOKE EVACUATOR (MISCELLANEOUS) ×2 IMPLANT
SEPRAFILM PROCEDURAL PACK 3X5 (MISCELLANEOUS) ×2 IMPLANT
SHEARS FOC LG CVD HARMONIC 17C (MISCELLANEOUS) ×2 IMPLANT
SPECIMEN JAR LARGE (MISCELLANEOUS) IMPLANT
SPONGE LAP 18X18 RF (DISPOSABLE) ×4 IMPLANT
STAPLER CVD CUT BL 40 RELOAD (ENDOMECHANICALS) ×2 IMPLANT
STAPLER PROXIMATE 55 BLUE (STAPLE) ×2 IMPLANT
STAPLER VISISTAT 35W (STAPLE) ×2 IMPLANT
SUCTION POOLE HANDLE (INSTRUMENTS) ×2
SURGILUBE 2OZ TUBE FLIPTOP (MISCELLANEOUS) IMPLANT
SUT PDS AB 1 TP1 54 (SUTURE) IMPLANT
SUT PDS AB 1 TP1 96 (SUTURE) ×4 IMPLANT
SUT PROLENE 2 0 CT2 30 (SUTURE) IMPLANT
SUT PROLENE 2 0 KS (SUTURE) IMPLANT
SUT SILK 2 0 SH CR/8 (SUTURE) ×2 IMPLANT
SUT SILK 2 0 TIES 10X30 (SUTURE) ×2 IMPLANT
SUT SILK 3 0 SH CR/8 (SUTURE) ×2 IMPLANT
SUT SILK 3 0 TIES 10X30 (SUTURE) ×2 IMPLANT
SUT VIC AB 3-0 SH 18 (SUTURE) IMPLANT
TOWEL GREEN STERILE (TOWEL DISPOSABLE) ×2 IMPLANT
TRAY FOLEY MTR SLVR 16FR STAT (SET/KITS/TRAYS/PACK) ×2 IMPLANT
TUBE CONNECTING 12X1/4 (SUCTIONS) ×4 IMPLANT
YANKAUER SUCT BULB TIP NO VENT (SUCTIONS) IMPLANT

## 2021-02-14 NOTE — Anesthesia Postprocedure Evaluation (Signed)
Anesthesia Post Note  Patient: Wendy Cooper  Procedure(s) Performed: EXPLORATORY LAPAROTOMY (Abdomen) PARTIAL COLECTOMY AND COLOSTOMY (Abdomen)     Patient location during evaluation: PACU Anesthesia Type: General Level of consciousness: awake and alert, oriented and patient cooperative Pain management: pain level controlled Vital Signs Assessment: post-procedure vital signs reviewed and stable Respiratory status: spontaneous breathing, nonlabored ventilation and respiratory function stable Cardiovascular status: blood pressure returned to baseline and stable Postop Assessment: no apparent nausea or vomiting Anesthetic complications: no   No notable events documented.  Last Vitals:  Vitals:   02/14/21 1450 02/14/21 1500  BP: (!) 159/97 (!) 158/96  Pulse: 74 77  Resp: 16 (!) 23  Temp:  (!) 36.1 C  SpO2: 98% 95%    Last Pain:  Vitals:   02/14/21 1500  TempSrc:   PainSc: Asleep                 Lannie Fields

## 2021-02-14 NOTE — Consult Note (Addendum)
WOC Nurse requested for preoperative stoma site marking  Discussed surgical procedure and stoma creation with patient.  Explained role of the WOC nurse team.  Provided the patient with educational booklet and provided samples of pouching options.  Answered patient's questions.   Examined patient lying and sitting upright in bed, in order to place the marking in the patient's visual field, away from any creases or abdominal contour issues and within the rectus muscle.  She was in a large amt pain and her stomach appears to be very tight and distended., which could mask folds which might be revealed when swelling subsides. Attempted to mark below the patient's belt line. There is a significant crease which appears lower on the abd when pt leans forward which should be avoided if possible.  Marked for colostomy in the LLQ  ___7_ cm to the left of the umbilicus and __4__cm below the umbilicus.  Marked for ileostomy in the RLQ  __7__cm to the right of the umbilicus and  __4__ cm below the umbilicus.  Patient's abdomen cleansed with CHG wipes at site markings, allowed to air dry prior to marking. Plans for surgery today. WOC Nurse team will follow up with patient after surgery for continued ostomy care and teaching.  Cammie Mcgee MSN, RN, CWOCN, [place], CNS [phone]

## 2021-02-14 NOTE — Plan of Care (Signed)
  Problem: Education: Goal: Knowledge of General Education information will improve Description: Including pain rating scale, medication(s)/side effects and non-pharmacologic comfort measures Outcome: Progressing   Problem: Education: Goal: Knowledge of General Education information will improve Description: Including pain rating scale, medication(s)/side effects and non-pharmacologic comfort measures Outcome: Progressing   Problem: Health Behavior/Discharge Planning: Goal: Ability to manage health-related needs will improve Outcome: Progressing   Problem: Clinical Measurements: Goal: Ability to maintain clinical measurements within normal limits will improve Outcome: Progressing Goal: Will remain free from infection Outcome: Progressing Goal: Diagnostic test results will improve Outcome: Progressing Goal: Respiratory complications will improve Outcome: Progressing Goal: Cardiovascular complication will be avoided Outcome: Progressing   Problem: Clinical Measurements: Goal: Will remain free from infection Outcome: Progressing   Problem: Clinical Measurements: Goal: Diagnostic test results will improve Outcome: Progressing   Problem: Clinical Measurements: Goal: Respiratory complications will improve Outcome: Progressing   Problem: Clinical Measurements: Goal: Cardiovascular complication will be avoided Outcome: Progressing   Problem: Activity: Goal: Risk for activity intolerance will decrease Outcome: Progressing   Problem: Nutrition: Goal: Adequate nutrition will be maintained Outcome: Progressing   Problem: Coping: Goal: Level of anxiety will decrease Outcome: Progressing   Problem: Elimination: Goal: Will not experience complications related to bowel motility Outcome: Progressing Goal: Will not experience complications related to urinary retention Outcome: Progressing   Problem: Elimination: Goal: Will not experience complications related to bowel  motility Outcome: Progressing Goal: Will not experience complications related to urinary retention Outcome: Progressing   Problem: Pain Managment: Goal: General experience of comfort will improve Outcome: Progressing   Problem: Safety: Goal: Ability to remain free from injury will improve Outcome: Progressing   Problem: Skin Integrity: Goal: Risk for impaired skin integrity will decrease Outcome: Progressing

## 2021-02-14 NOTE — Anesthesia Preprocedure Evaluation (Signed)
Anesthesia Evaluation  Patient identified by MRN, date of birth, ID band Patient awake    Reviewed: Allergy & Precautions, NPO status , Patient's Chart, lab work & pertinent test results  Airway Mallampati: II  TM Distance: >3 FB Neck ROM: Full    Dental no notable dental hx.    Pulmonary neg pulmonary ROS, former smoker,    Pulmonary exam normal breath sounds clear to auscultation       Cardiovascular hypertension, Pt. on medications negative cardio ROS Normal cardiovascular exam Rhythm:Regular Rate:Normal     Neuro/Psych Anxiety negative neurological ROS  negative psych ROS   GI/Hepatic negative GI ROS, Neg liver ROS,   Endo/Other  negative endocrine ROS  Renal/GU negative Renal ROS  negative genitourinary   Musculoskeletal negative musculoskeletal ROS (+)   Abdominal (+) + obese,   Peds negative pediatric ROS (+)  Hematology negative hematology ROS (+)   Anesthesia Other Findings   Reproductive/Obstetrics negative OB ROS                             Anesthesia Physical Anesthesia Plan  ASA: 2  Anesthesia Plan: General   Post-op Pain Management:    Induction: Intravenous  PONV Risk Score and Plan: 3 and Ondansetron, Dexamethasone, Midazolam and Treatment may vary due to age or medical condition  Airway Management Planned: Oral ETT  Additional Equipment:   Intra-op Plan:   Post-operative Plan: Extubation in OR  Informed Consent: I have reviewed the patients History and Physical, chart, labs and discussed the procedure including the risks, benefits and alternatives for the proposed anesthesia with the patient or authorized representative who has indicated his/her understanding and acceptance.     Dental advisory given  Plan Discussed with: CRNA  Anesthesia Plan Comments:         Anesthesia Quick Evaluation

## 2021-02-14 NOTE — Anesthesia Procedure Notes (Signed)
Procedure Name: Intubation Date/Time: 02/14/2021 12:42 PM Performed by: Inda Coke, CRNA Pre-anesthesia Checklist: Patient identified, Emergency Drugs available, Suction available and Patient being monitored Patient Re-evaluated:Patient Re-evaluated prior to induction Oxygen Delivery Method: Circle System Utilized Preoxygenation: Pre-oxygenation with 100% oxygen Induction Type: IV induction Ventilation: Mask ventilation without difficulty and Oral airway inserted - appropriate to patient size Laryngoscope Size: Mac and 3 Grade View: Grade I Tube type: Oral Tube size: 7.0 mm Number of attempts: 1 Airway Equipment and Method: Stylet and Oral airway Placement Confirmation: ETT inserted through vocal cords under direct vision, positive ETCO2 and breath sounds checked- equal and bilateral Secured at: 22 cm Tube secured with: Tape Dental Injury: Teeth and Oropharynx as per pre-operative assessment

## 2021-02-14 NOTE — Progress Notes (Signed)
PROGRESS NOTE  Wendy Cooper  ALP:[id number] DOB: 1971-04-20 DOA: 02/11/2021 PCP: Clemencia Course, PA-C   Brief Narrative: Wendy Cooper is a 50 y.o. female with a history of colitis, HTN, and anxiety who presented to the ED at [facility] initially 6/6 for severe abdominal pain. She left after 7 hours without being seen only to return the next day to [facility] for the same complaint. She was found to have leukocytosis (WBC 19.5k) and developed a fever to 101.63F after several hours in the waiting room and was taken for CT abd/pelvis which demonstrated sigmoid colon perforation with walled off 1.1 - 1.3 cm air collection. She was started on zosyn, IV fluids, IV analgesics, and surgery was consulted. Due to persistent evidence of peritonitis, operative management will be pursued 6/10.   Assessment & Plan: Principal Problem:   Diverticulitis of colon with perforation Active Problems:   Essential hypertension   Leukocytosis   Anxiety   Tobacco use  Sepsis due to perforated sigmoid diverticulitis: First episode of diverticulitis, though is severe. No improvement thus far clinically, though WBC down. - To OR today per surgery.   - Continue Zosyn.  - Postop diet and analgesia per surgery.   HTN: Currently hypotensive. HR not rising, other vital signs stable. Lactic acid wnl. Verified LA to be normal.  - Holding antihypertensives, though hypotension is stabilizing.   Anxiety, depression:  - Xanax changed to ativan as pt is NPO - Restart home cymbalta  Tobacco use:  - Nicotine patch - Cessation counseling to be provided prior to discharge.   Hypokalemia:  - Augment supplementation in IVF, recheck in AM  Insomnia:  - Restarted remeron (disintegrating tab)  Obesity: Estimated body mass index is 34.97 kg/m as calculated from the following:   Height as of 02/10/21: 5\' 8"  (1.727 m).   Weight as of 02/10/21: 104.3 kg.  DVT prophylaxis: Lovenox 40mg  q24h (ok per surgery) Code Status:  Full Family Communication: None at bedside Disposition Plan:  Status is: Inpatient  Remains inpatient appropriate because:Ongoing active pain requiring inpatient pain management and IV treatments appropriate due to intensity of illness or inability to take PO   Dispo: The patient is from: Home              Anticipated d/c is to: Home              Patient currently is not medically stable to d/c.   Difficult to place patient No  Consultants:  General surgery  Procedures:  TBD 02/14/2021  Antimicrobials: Zosyn 6/7 >>   Subjective: Severe dry mouth and lower abdominal pain is severe, constant, no better than before, improved with IV analgesics but needing these around the clock.   Objective: Vitals:   02/13/21 2306 02/14/21 0134 02/14/21 0446 02/14/21 1146  BP: 129/78 120/81 (!) 127/93 136/81  Pulse: 74 77 70 70  Resp: 17 16 17 18   Temp: 98.5 F (36.9 C) 98.5 F (36.9 C) (!) 97.5 F (36.4 C) 98.2 F (36.8 C)  TempSrc: Oral Oral Oral Oral  SpO2: 99% 97% 98% 99%    Intake/Output Summary (Last 24 hours) at 02/14/2021 1312 Last data filed at 02/14/2021 1250 Gross per 24 hour  Intake 72.77 ml  Output --  Net 72.77 ml   Gen: 50 y.o. female in no distress Pulm: Nonlabored breathing room air. Clear. CV: Regular rate and rhythm. No murmur, rub, or gallop. No JVD, no dependent edema. GI: Abdomen distended, slightly rigid with severe tenderness.  Ext: Warm, no deformities Skin: No rashes, lesions or ulcers on visualized skin. Neuro: Alert and oriented. No focal neurological deficits. Psych: Judgement and insight appear intact. Labile. Mood anxious& affect broad. Behavior is appropriate.    Data Reviewed: I have personally reviewed following labs and imaging studies  CBC: Recent Labs  Lab 02/10/21 1816 02/11/21 1315 02/12/21 0357 02/13/21 0800 02/14/21 0128  WBC 15.6* 19.5* 17.0* 11.4* 9.1  NEUTROABS  --   --   --  8.3* 5.4  HGB 13.8 13.6 12.0 11.0* 11.0*  HCT 41.3  40.9 36.6 33.0* 33.0*  MCV 87.9 87.2 89.5 88.0 88.7  PLT 266 253 190 208 232   Basic Metabolic Panel: Recent Labs  Lab 02/10/21 1816 02/11/21 1315 02/12/21 0357 02/14/21 0128  NA 134* 135 136 140  K 4.0 3.6 3.4* 3.3*  CL 102 103 102 106  CO2 25 22 23 27   GLUCOSE 108* 107* 102* 95  BUN 17 13 9  5*  CREATININE 0.76 0.80 0.74 0.84  CALCIUM 9.1 9.3 9.0 8.8*   GFR: Estimated Creatinine Clearance: 101.3 mL/min (by C-G formula based on SCr of 0.84 mg/dL). Liver Function Tests: Recent Labs  Lab 02/10/21 1816 02/11/21 1315 02/14/21 0128  AST 15 13* 14*  ALT 13 11 13   ALKPHOS 67 62 56  BILITOT 0.4 0.9 0.6  PROT 7.4 7.1 5.8*  ALBUMIN 4.0 3.5 2.6*   Recent Labs  Lab 02/10/21 1816 02/11/21 1315  LIPASE 28 20   Urine analysis:    Component Value Date/Time   COLORURINE YELLOW 02/11/2021 1413   APPEARANCEUR HAZY (A) 02/11/2021 1413   LABSPEC 1.017 02/11/2021 1413   PHURINE 6.0 02/11/2021 1413   GLUCOSEU NEGATIVE 02/11/2021 1413   HGBUR SMALL (A) 02/11/2021 1413   BILIRUBINUR NEGATIVE 02/11/2021 1413   KETONESUR NEGATIVE 02/11/2021 1413   PROTEINUR NEGATIVE 02/11/2021 1413   NITRITE NEGATIVE 02/11/2021 1413   LEUKOCYTESUR TRACE (A) 02/11/2021 1413   Recent Results (from the past 240 hour(s))  Resp Panel by RT-PCR (Flu A&B, Covid) Nasopharyngeal Swab     Status: None   Collection Time: 02/11/21  9:26 PM   Specimen: Nasopharyngeal Swab; Nasopharyngeal(NP) swabs in vial transport medium  Result Value Ref Range Status   SARS Coronavirus 2 by RT PCR NEGATIVE NEGATIVE Final    Comment: (NOTE) SARS-CoV-2 target nucleic acids are NOT DETECTED.  The SARS-CoV-2 RNA is generally detectable in upper respiratory specimens during the acute phase of infection. The lowest concentration of SARS-CoV-2 viral copies this assay can detect is 138 copies/mL. A negative result does not preclude SARS-Cov-2 infection and should not be used as the sole basis for treatment or other patient  management decisions. A negative result may occur with  improper specimen collection/handling, submission of specimen other than nasopharyngeal swab, presence of viral mutation(s) within the areas targeted by this assay, and inadequate number of viral copies(<138 copies/mL). A negative result must be combined with clinical observations, patient history, and epidemiological information. The expected result is Negative.  Fact Sheet for Patients:  04/13/2021  Fact Sheet for Healthcare Providers:  04/13/2021  This test is no t yet approved or cleared by the 04/13/21 FDA and  has been authorized for detection and/or diagnosis of SARS-CoV-2 by FDA under an Emergency Use Authorization (EUA). This EUA will remain  in effect (meaning this test can be used) for the duration of the COVID-19 declaration under Section 564(b)(1) of the Act, 21 U.S.C.section 360bbb-3(b)(1), unless the authorization is terminated  or  revoked sooner.       Influenza A by PCR NEGATIVE NEGATIVE Final   Influenza B by PCR NEGATIVE NEGATIVE Final    Comment: (NOTE) The Xpert Xpress SARS-CoV-2/FLU/RSV plus assay is intended as an aid in the diagnosis of influenza from Nasopharyngeal swab specimens and should not be used as a sole basis for treatment. Nasal washings and aspirates are unacceptable for Xpert Xpress SARS-CoV-2/FLU/RSV testing.  Fact Sheet for Patients: BloggerCourse.com  Fact Sheet for Healthcare Providers: SeriousBroker.it  This test is not yet approved or cleared by the [place] FDA and has been authorized for detection and/or diagnosis of SARS-CoV-2 by FDA under an Emergency Use Authorization (EUA). This EUA will remain in effect (meaning this test can be used) for the duration of the COVID-19 declaration under Section 564(b)(1) of the Act, 21 U.S.C. section 360bbb-3(b)(1),  unless the authorization is terminated or revoked.  Performed at [facility], [address] [place], [place] [address]       Radiology Studies: No results found.   Scheduled Meds:  [MAR Hold] enoxaparin (LOVENOX) injection  40 mg Subcutaneous Q24H   [MAR Hold] mirtazapine  30 mg Oral QHS   [MAR Hold] nicotine  7 mg Transdermal Daily   [MAR Hold] valACYclovir  500 mg Oral Daily   Continuous Infusions:  dextrose 5 % and 0.45 % NaCl with KCl 40 mEq/L 100 mL/hr at 02/14/21 0757   lactated ringers     [MAR Hold] methocarbamol (ROBAXIN) IV 500 mg (02/12/21 1200)   [MAR Hold] piperacillin-tazobactam (ZOSYN)  IV 3.375 g (02/14/21 0456)     LOS: 3 days   Time spent: 35 minutes.  Tyrone Nine, MD [facility] [email or web address] 02/14/2021, 1:12 PM

## 2021-02-14 NOTE — Progress Notes (Signed)
Progress Note     Subjective: CC: No improvement in abdominal pain today. She had a BM and also had discomfort with this. She has been ambulatory. She denies nausea or emesis   Objective: Vital signs in last 24 hours: Temp:  [97.5 F (36.4 C)-98.5 F (36.9 C)] 97.5 F (36.4 C) (06/10 0446) Pulse Rate:  [62-77] 70 (06/10 0446) Resp:  [16-17] 17 (06/10 0446) BP: (105-129)/(71-93) 127/93 (06/10 0446) SpO2:  [97 %-100 %] 98 % (06/10 0446) Last BM Date: 02/13/21  Intake/Output from previous day: 06/09 0701 - 06/10 0700 In: 22.8 [IV Piggyback:22.8] Out: -  Intake/Output this shift: No intake/output data recorded.  PE: General: WD, female who is laying in bed in NAD HEENT: head is normocephalic, atraumatic.  Mouth is pink and moist Heart: Palpable radial and pedal pulses bilaterally Lungs: Respiratory effort nonlabored on room air Abd: soft, ND, +BS. Moderate to severe tenderness to palpation in bilateral lower quadrants with guarding MS: all 4 extremities are symmetrical with no cyanosis, clubbing, or edema. Skin: warm and dry with no masses, lesions, or rashes Psych: A&Ox3 with an appropriate affect.   Lab Results:  Recent Labs    02/13/21 0800 02/14/21 0128  WBC 11.4* 9.1  HGB 11.0* 11.0*  HCT 33.0* 33.0*  PLT 208 232   BMET Recent Labs    02/12/21 0357 02/14/21 0128  NA 136 140  K 3.4* 3.3*  CL 102 106  CO2 23 27  GLUCOSE 102* 95  BUN 9 5*  CREATININE 0.74 0.84  CALCIUM 9.0 8.8*   PT/INR No results for input(s): LABPROT, INR in the last 72 hours. CMP     Component Value Date/Time   NA 140 02/14/2021 0128   K 3.3 (L) 02/14/2021 0128   CL 106 02/14/2021 0128   CO2 27 02/14/2021 0128   GLUCOSE 95 02/14/2021 0128   BUN 5 (L) 02/14/2021 0128   CREATININE 0.84 02/14/2021 0128   CALCIUM 8.8 (L) 02/14/2021 0128   PROT 5.8 (L) 02/14/2021 0128   ALBUMIN 2.6 (L) 02/14/2021 0128   AST 14 (L) 02/14/2021 0128   ALT 13 02/14/2021 0128   ALKPHOS 56  02/14/2021 0128   BILITOT 0.6 02/14/2021 0128   GFRNONAA >60 02/14/2021 0128   Lipase     Component Value Date/Time   LIPASE 20 02/11/2021 1315       Studies/Results: No results found.  Anti-infectives: Anti-infectives (From admission, onward)    Start     Dose/Rate Route Frequency Ordered Stop   02/13/21 1015  valACYclovir (VALTREX) tablet 500 mg        500 mg Oral Daily 02/13/21 0921 02/16/21 0959   02/12/21 0600  piperacillin-tazobactam (ZOSYN) IVPB 3.375 g        3.375 g 12.5 mL/hr over 240 Minutes Intravenous Every 8 hours 02/11/21 2317     02/11/21 2315  piperacillin-tazobactam (ZOSYN) IVPB 3.375 g  Status:  Discontinued        3.375 g 12.5 mL/hr over 240 Minutes Intravenous Every 8 hours 02/11/21 2313 02/11/21 2317   02/11/21 2130  piperacillin-tazobactam (ZOSYN) IVPB 3.375 g        3.375 g 100 mL/hr over 30 Minutes Intravenous  Once 02/11/21 2129 02/11/21 2356        Assessment/Plan  Perforated diverticulitis - moderate to severe abdominal pain, no nausea - multimodal pain control - WBC 9.1 (11.4), continue IV abx - to OR today with Dr. Derrell Lolling for exploratory laparatomy partial colectomy, colostomy creation.  Risks/benefits/alternatives discussed with patient, all questions answered, she voices understanding and agrees to proceed - WOC consult   FEN: NPO, IVF ID: [email or web address] 6/7>> VTE: SCDs, lovenox   ~per TRH~ HTN Tobacco use Anxiety Insomnia    LOS: 3 days    Eric Form, [facility] 02/14/2021, 7:36 AM Please see Amion for pager number during day hours 7:00am-4:30pm

## 2021-02-14 NOTE — Transfer of Care (Signed)
Immediate Anesthesia Transfer of Care Note  Patient: Wendy Cooper  Procedure(s) Performed: EXPLORATORY LAPAROTOMY (Abdomen) PARTIAL COLECTOMY AND COLOSTOMY (Abdomen)  Patient Location: PACU  Anesthesia Type:General  Level of Consciousness: awake and alert   Airway & Oxygen Therapy: Patient Spontanous Breathing and Patient connected to nasal cannula oxygen  Post-op Assessment: Report given to RN and Post -op Vital signs reviewed and stable  Post vital signs: Reviewed and stable  Last Vitals:  Vitals Value Taken Time  BP 146/94 02/14/21 1416  Temp 36.1 C 02/14/21 1415  Pulse 73 02/14/21 1424  Resp 21 02/14/21 1424  SpO2 98 % 02/14/21 1424  Vitals shown include unvalidated device data.  Last Pain:  Vitals:   02/14/21 1415  TempSrc:   PainSc: 10-Worst pain ever      Patients Stated Pain Goal: 0 (02/14/21 1415)  Complications: No notable events documented.

## 2021-02-14 NOTE — Op Note (Signed)
02/14/2021  1:58 PM  PATIENT:  Wendy Cooper  50 y.o. female  PRE-OPERATIVE DIAGNOSIS:  PERFORATED DIVERTICULITIS  POST-OPERATIVE DIAGNOSIS:  PERFORATED DIVERTICULITIS  PROCEDURE:  Procedure(s) with comments: EXPLORATORY LAPAROTOMY (N/A) -  PARTIAL COLECTOMY AND COLOSTOMY (N/A)  SURGEON:  Surgeon(s) and Role:    Axel Filler, MD - Primary  PHYSICIAN ASSISTANT: Bailey Mech, PA-C-who was essential at retraction, manipulation of the sigmoid colon, and assistance with suturing of the ostomy.   ANESTHESIA:   general  EBL:  50 mL   BLOOD ADMINISTERED:none  DRAINS: none   LOCAL MEDICATIONS USED:  NONE  SPECIMEN:  Source of Specimen: Sigmoid colon  DISPOSITION OF SPECIMEN:  PATHOLOGY  COUNTS:  YES  TOURNIQUET:  * No tourniquets in log *  DICTATION: .Dragon Dictation Indication procedure: Patient is a 50 year old female, who came in secondary to abdominal pain.  Patient was found to have perforated contained diverticulitis.  Patient underwent nonoperative management.  Patient had continued pain and failed nonoperative management.  Patient decided to have colon resection and ostomy.  Findings: Patient had a significant mount of inflammation in the sigmoid colon.  Patient did have a very lengthy sigmoid colon.  This was taken down to the proximal portion of the rectum which had normal healthy tissue.  The proximal left colon was transected, there was no tension at the colostomy.  The splenic flexure was not taken down.  The rectal stump was tagged with 0 Prolene's x2 at the staple line.  Seprafilm was placed in the pelvis above the staple line.  Details of procedure: The patient was consented she was taken back to the OR and placed in supine position with bilateral SCDs in place.  Patient underwent general endotracheal intubation.  Patient was prepped and draped in standard fashion.  A timeout was called and all facts verified.  At this time a midline incision was made using  a #10 blade.  Dissection taken down to the linea alba.  With Bovie cautery.  At this time the fascia was elevated between 2 Kocher's.  The peritoneum was entered bluntly.  This was at the area of the previous ventral hernia repair with mesh.  The mesh was encountered.  This was well incorporated left in place.  This appeared to be in the subrectus space.  This was left in place and transected using curved nails. At this time the fascia was extended to the length of skin incision.  A Balfour retractor was placed to help with abdominal wall retraction.  At this time I was able to feel the thickening of the sigmoid colon.  There were some small bowel adhesions to the sigmoid colon.  There was no abscess or feculent peritonitis that could be seen.  At this time all adhesions to the sigmoid colon broken up with finger fracture.  At this time an area of the proximal sigmoid colon was chosen for transection.  A mesenteric window was made and a 75 GIA stapler was used to transect the colon.  At this time a harmonic scalpel was used to ligate the mesentery of the sigmoid colon and rectum.  This was taken down to the proximal portion of the rectum.  It should be noted that the dissection took place along the length of the sigmoid colon and not deeper.  The left ureter was not visualized.  At this time we were able to dissect away the surrounding fat from the proximal rectum.  A blue contour was then used to transect the  proximal rectum.  The specimen was sent off to pathology.  At this time the pelvis irrigated out with sterile saline.  0 Prolene's were used to tack the staple line x2.  Seprafilm was then placed into the pelvis over the staple line.  At this time a skin incision was made for the ostomy at the previously marked ostomy site.  The subcutaneous fat was removed.  Dissection was taken down to the anterior fascia.  This was incised in a cruciate fashion.  The peritoneum was also incised in a cruciate  fashion.  The colon was brought up through the ostomy site.  At this time the midline fascia was then reapproximate using #1 single-stranded PDS x2 in a standard running fashion.  The skin was loosely approximated.  The colostomy was matured using 3-0 Vicryl's in interrupted fashion.  An ostomy appliance was placed.  Patient tolerated procedure well was taken to the recovery in stable condition.      PLAN OF CARE: Admit to inpatient   PATIENT DISPOSITION:  PACU - hemodynamically stable.   Delay start of Pharmacological VTE agent (>24hrs) due to surgical blood loss or risk of bleeding: yes

## 2021-02-14 NOTE — Plan of Care (Signed)

## 2021-02-15 ENCOUNTER — Encounter (HOSPITAL_COMMUNITY): Payer: Self-pay | Admitting: General Surgery

## 2021-02-15 LAB — CBC
HCT: 36.8 % (ref 36.0–46.0)
Hemoglobin: 12.3 g/dL (ref 12.0–15.0)
MCH: 29.4 pg (ref 26.0–34.0)
MCHC: 33.4 g/dL (ref 30.0–36.0)
MCV: 88 fL (ref 80.0–100.0)
Platelets: 287 10*3/uL (ref 150–400)
RBC: 4.18 MIL/uL (ref 3.87–5.11)
RDW: 13.2 % (ref 11.5–15.5)
WBC: 13.2 10*3/uL — ABNORMAL HIGH (ref 4.0–10.5)
nRBC: 0 % (ref 0.0–0.2)

## 2021-02-15 LAB — BASIC METABOLIC PANEL
Anion gap: 10 (ref 5–15)
BUN: <5 mg/dL — ABNORMAL LOW (ref 6–20)
CO2: 22 mmol/L (ref 22–32)
Calcium: 8.9 mg/dL (ref 8.9–10.3)
Chloride: 107 mmol/L (ref 98–111)
Creatinine, Ser: 0.73 mg/dL (ref 0.44–1.00)
GFR, Estimated: >60 mL/min (ref >60)
Glucose, Bld: 126 mg/dL — ABNORMAL HIGH (ref 70–99)
Potassium: 4.1 mmol/L (ref 3.5–5.1)
Sodium: 139 mmol/L (ref 135–145)

## 2021-02-15 LAB — MAGNESIUM: Magnesium: 1.8 mg/dL (ref 1.7–2.4)

## 2021-02-15 MED ORDER — HYDROCHLOROTHIAZIDE 12.5 MG PO CAPS
12.5000 mg | ORAL_CAPSULE | Freq: Every day | ORAL | Status: DC
  Administered 2021-02-15 – 2021-02-20 (×6): 12.5 mg via ORAL
  Filled 2021-02-15 (×6): qty 1

## 2021-02-15 MED ORDER — LISINOPRIL 20 MG PO TABS
20.0000 mg | ORAL_TABLET | Freq: Every day | ORAL | Status: DC
  Administered 2021-02-15 – 2021-02-20 (×6): 20 mg via ORAL
  Filled 2021-02-15 (×6): qty 1

## 2021-02-15 MED ORDER — KCL IN DEXTROSE-NACL 20-5-0.45 MEQ/L-%-% IV SOLN
INTRAVENOUS | Status: DC
  Filled 2021-02-15 (×10): qty 1000

## 2021-02-15 NOTE — Progress Notes (Signed)
1 Day Post-Op Hartman's procedure Subjective: Pain controlled relatively well with PCA, no nausea  Objective: Vital signs in last 24 hours: Temp:  [97 F (36.1 C)-98.2 F (36.8 C)] 97.8 F (36.6 C) (06/11 0557) Pulse Rate:  [70-86] 84 (06/11 0557) Resp:  [14-27] 17 (06/11 0557) BP: (136-168)/(81-101) 168/101 (06/11 0557) SpO2:  [94 %-99 %] 99 % (06/11 0557) FiO2 (%):  [30 %-38 %] 38 % (06/11 0503)   Intake/Output from previous day: 06/10 0701 - 06/11 0700 In: 2471.7 [I.V.:2271.8; IV Piggyback:199.9] Out: 2225 [Urine:2025; Blood:200] Intake/Output this shift: No intake/output data recorded.   General appearance: alert and cooperative GI: soft, distended  Incision: dressing clean, dry  Lab Results:  Recent Labs    02/14/21 0128 02/15/21 0109  WBC 9.1 13.2*  HGB 11.0* 12.3  HCT 33.0* 36.8  PLT 232 287   BMET Recent Labs    02/14/21 0128 02/15/21 0109  NA 140 139  K 3.3* 4.1  CL 106 107  CO2 27 22  GLUCOSE 95 126*  BUN 5* <5*  CREATININE 0.84 0.73  CALCIUM 8.8* 8.9   PT/INR No results for input(s): LABPROT, INR in the last 72 hours. ABG No results for input(s): PHART, HCO3 in the last 72 hours.  Invalid input(s): PCO2, PO2  MEDS, Scheduled  acetaminophen  1,000 mg Oral Q6H   Chlorhexidine Gluconate Cloth  6 each Topical Daily   DULoxetine  20 mg Oral Daily   enoxaparin (LOVENOX) injection  40 mg Subcutaneous Q24H   HYDROmorphone   Intravenous Q4H   mirtazapine  30 mg Oral QHS   nicotine  7 mg Transdermal Daily   valACYclovir  500 mg Oral Daily    Studies/Results: No results found.  Assessment: s/p Procedure(s): EXPLORATORY LAPAROTOMY PARTIAL COLECTOMY AND COLOSTOMY Patient Active Problem List   Diagnosis Date Noted   Diverticulitis of colon with perforation 02/11/2021   Leukocytosis 02/11/2021   Anxiety 02/11/2021   Tobacco use 02/11/2021   Essential hypertension 12/20/2019    S/p open Hartman's procedure: 6/10 Dr  Derrell Lolling  Plan: Cont PCA  for pain control Ambulate in [facility] for sips of clears Cont Zosyn for 5 more days  LOS: 4 days     .Vanita Panda, MD [facility], [place]    02/15/2021 8:49 AM

## 2021-02-15 NOTE — Progress Notes (Signed)
PROGRESS NOTE  Wendy Cooper  GYK:[id number] DOB: [birth year] DOA: 02/11/2021 PCP: Clemencia Course, PA-C   Brief Narrative: Wendy Cooper is a 50 y.o. female with a history of colitis, HTN, and anxiety who presented to the ED at [facility] initially 6/6 for severe abdominal pain. She left after 7 hours without being seen only to return the next day to [facility] for the same complaint. She was found to have leukocytosis (WBC 19.5k) and developed a fever to 101.34F after several hours in the waiting room and was taken for CT abd/pelvis which demonstrated sigmoid colon perforation with walled off 1.1 - 1.3 cm air collection. She was started on zosyn, IV fluids, IV analgesics, and surgery was consulted. Due to persistent evidence of peritonitis, operative management will be pursued 6/10.   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 Derrell Lolling.  - Continue zosyn for 5 more days per surgery - Postop diet (NPO) and analgesia (multimodal including dilaudid PCA) per surgery.   HTN:  - Restart lisinopril/HCTZ home med as BP is now rising as it is at baseline.   Anxiety, depression:  - Xanax changed to ativan as pt is NPO - Restarted home cymbalta if able to take per surgery.  Tobacco use:  - Nicotine patch - Cessation counseling to be provided prior to discharge.   Hypokalemia:  - Continue supplementation in balanced IVF.  Insomnia:  - Restarted remeron (disintegrating tab)  Obesity: Estimated body mass index is 34.97 kg/m as calculated from the following:   Height as of 02/10/21: 5\' 8"  (1.727 m).   Weight as of 02/10/21: 104.3 kg.  DVT prophylaxis: Lovenox 40mg  q24h (ok per surgery) Code Status: Full Family Communication: None at bedside Disposition Plan:   Status is: Inpatient  Remains inpatient appropriate because:Ongoing active pain requiring inpatient pain management and IV treatments appropriate due to intensity of illness or inability to take PO   Dispo: The patient is from: Home              Anticipated d/c is to: Home              Patient currently is not medically stable to d/c.   Difficult to place patient No  Consultants:  General surgery  Procedures:  Colectomy/colostomy 02/14/2021, Dr.  Antimicrobials: Zosyn 6/7 >>   Subjective: Pain in abdomen is diffuse, severe, constant, incompletely controlled with dilaudid PCA. Wants to take more po, but is tolerating ice chips. Foley out and pt reports needing to urinate.   Objective: Vitals:   02/15/21 0012 02/15/21 0207 02/15/21 0503 02/15/21 0557  BP:  (!) 149/87  (!) 168/101  Pulse:  86  84  Resp: 17 17 17 17   Temp:  98.1 F (36.7 C)  97.8 F (36.6 C)  TempSrc:  Oral  Oral  SpO2: 95% 96% 96% 99%    Intake/Output Summary (Last 24 hours) at 02/15/2021 0958 Last data filed at 02/15/2021 0559 Gross per 24 hour  Intake 2471.67 ml  Output 2225 ml  Net 246.67 ml   Gen: 50 y.o. female in no distress Pulm: Nonlabored breathing room air. Clear. CV: Regular rate and rhythm. No murmur, rub, or gallop. No JVD, no dependent edema. GI: Abdomen soft, very tender diffusely limiting exam. Right-vertical midline incision with honeycomb dressing  hemostatic without spreading erythema. Colostomy pink with scant serosanguinous discharge in bag, no stool. Normoactive bowel sounds.  Ext: Warm, no deformities Skin: Abdominal wounds as above, otherwise no rashes, lesions or ulcers on visualized skin. Neuro: Alert and oriented. No focal neurological deficits. Psych: Judgement and insight appear fair. Mood euthymic & affect congruent. Behavior is appropriate.    Data Reviewed: I have personally reviewed following labs and imaging studies  CBC: Recent Labs  Lab 02/11/21 1315  02/12/21 0357 02/13/21 0800 02/14/21 0128 02/15/21 0109  WBC 19.5* 17.0* 11.4* 9.1 13.2*  NEUTROABS  --   --  8.3* 5.4  --   HGB 13.6 12.0 11.0* 11.0* 12.3  HCT 40.9 36.6 33.0* 33.0* 36.8  MCV 87.2 89.5 88.0 88.7 88.0  PLT 253 190 208 232 287   Basic Metabolic Panel: Recent Labs  Lab 02/10/21 1816 02/11/21 1315 02/12/21 0357 02/14/21 0128 02/15/21 0109  NA 134* 135 136 140 139  K 4.0 3.6 3.4* 3.3* 4.1  CL 102 103 102 106 107  CO2 25 22 23 27 22   GLUCOSE 108* 107* 102* 95 126*  BUN 17 13 9  5* <5*  CREATININE 0.76 0.80 0.74 0.84 0.73  CALCIUM 9.1 9.3 9.0 8.8* 8.9  MG  --   --   --   --  1.8   GFR: Estimated Creatinine Clearance: 106.4 mL/min (by C-G formula based on SCr of 0.73 mg/dL). Liver Function Tests: Recent Labs  Lab 02/10/21 1816 02/11/21 1315 02/14/21 0128  AST 15 13* 14*  ALT 13 11 13   ALKPHOS 67 62 56  BILITOT 0.4 0.9 0.6  PROT 7.4 7.1 5.8*  ALBUMIN 4.0 3.5 2.6*   Recent Labs  Lab 02/10/21 1816 02/11/21 1315  LIPASE 28 20   Urine analysis:    Component Value Date/Time   COLORURINE YELLOW 02/11/2021 1413   APPEARANCEUR HAZY (A) 02/11/2021 1413   LABSPEC 1.017 02/11/2021 1413   PHURINE 6.0 02/11/2021 1413   GLUCOSEU NEGATIVE 02/11/2021 1413   HGBUR SMALL (A) 02/11/2021 1413   BILIRUBINUR NEGATIVE 02/11/2021 1413   KETONESUR NEGATIVE 02/11/2021 1413   PROTEINUR NEGATIVE 02/11/2021 1413   NITRITE NEGATIVE 02/11/2021 1413   LEUKOCYTESUR TRACE (A) 02/11/2021 1413   Recent Results (from the past 240 hour(s))  Resp Panel by RT-PCR (Flu A&B, Covid) Nasopharyngeal Swab     Status: None   Collection Time: 02/11/21  9:26 PM   Specimen: Nasopharyngeal Swab; Nasopharyngeal(NP) swabs in vial transport medium  Result Value Ref Range Status   SARS Coronavirus 2 by RT PCR NEGATIVE NEGATIVE Final    Comment: (NOTE) SARS-CoV-2 target nucleic acids are NOT DETECTED.  The SARS-CoV-2 RNA is generally detectable in upper respiratory specimens during the  acute phase of infection. The lowest concentration of SARS-CoV-2 viral copies this assay can detect is 138 copies/mL. A negative result does not preclude SARS-Cov-2 infection and should not be used as the sole basis for treatment or other patient management decisions. A negative result may occur with  improper specimen collection/handling, submission of specimen other than nasopharyngeal swab, presence of viral mutation(s) within the areas targeted by this assay, and inadequate number of viral copies(<138 copies/mL). A negative result must be combined with clinical observations, patient history, and epidemiological information. The expected result is Negative.  Fact Sheet for Patients:  04/13/2021  Fact Sheet for Healthcare Providers:  04/13/2021  This test is no t yet approved or cleared by the 04/13/21 FDA and  has been authorized for detection  and/or diagnosis of SARS-CoV-2 by FDA under an Emergency Use Authorization (EUA). This EUA will remain  in effect (meaning this test can be used) for the duration of the COVID-19 declaration under Section 564(b)(1) of the Act, 21 U.S.C.section 360bbb-3(b)(1), unless the authorization is terminated  or revoked sooner.       Influenza A by PCR NEGATIVE NEGATIVE Final   Influenza B by PCR NEGATIVE NEGATIVE Final    Comment: (NOTE) The Xpert Xpress SARS-CoV-2/FLU/RSV plus assay is intended as an aid in the diagnosis of influenza from Nasopharyngeal swab specimens and should not be used as a sole basis for treatment. Nasal washings and aspirates are unacceptable for Xpert Xpress SARS-CoV-2/FLU/RSV testing.  Fact Sheet for Patients: BloggerCourse.com  Fact Sheet for Healthcare Providers: SeriousBroker.it  This test is not yet approved or cleared by the [place] FDA and has been authorized for detection and/or  diagnosis of SARS-CoV-2 by FDA under an Emergency Use Authorization (EUA). This EUA will remain in effect (meaning this test can be used) for the duration of the COVID-19 declaration under Section 564(b)(1) of the Act, 21 U.S.C. section 360bbb-3(b)(1), unless the authorization is terminated or revoked.  Performed at [facility], [address], [place], [place] [address]       Radiology Studies: No results found.   Scheduled Meds:  acetaminophen  1,000 mg Oral Q6H   Chlorhexidine Gluconate Cloth  6 each Topical Daily   DULoxetine  20 mg Oral Daily   enoxaparin (LOVENOX) injection  40 mg Subcutaneous Q24H   HYDROmorphone   Intravenous Q4H   mirtazapine  30 mg Oral QHS   nicotine  7 mg Transdermal Daily   Continuous Infusions:  dextrose 5 % and 0.45 % NaCl with KCl 40 mEq/L 100 mL/hr at 02/15/21 0217   methocarbamol (ROBAXIN) IV 1,000 mg (02/15/21 0947)   piperacillin-tazobactam (ZOSYN)  IV 3.375 g (02/15/21 0503)     LOS: 4 days   Time spent: 35 minutes.  Tyrone Nine, MD [facility] [email or web address] 02/15/2021, 9:58 AM

## 2021-02-15 NOTE — Plan of Care (Signed)

## 2021-02-16 LAB — BASIC METABOLIC PANEL
Anion gap: 8 (ref 5–15)
BUN: <5 mg/dL — ABNORMAL LOW (ref 6–20)
CO2: 25 mmol/L (ref 22–32)
Calcium: 8.8 mg/dL — ABNORMAL LOW (ref 8.9–10.3)
Chloride: 104 mmol/L (ref 98–111)
Creatinine, Ser: 0.82 mg/dL (ref 0.44–1.00)
GFR, Estimated: >60 mL/min (ref >60)
Glucose, Bld: 104 mg/dL — ABNORMAL HIGH (ref 70–99)
Potassium: 3.8 mmol/L (ref 3.5–5.1)
Sodium: 137 mmol/L (ref 135–145)

## 2021-02-16 LAB — CBC
HCT: 36.1 % (ref 36.0–46.0)
Hemoglobin: 11.9 g/dL — ABNORMAL LOW (ref 12.0–15.0)
MCH: 29.2 pg (ref 26.0–34.0)
MCHC: 33 g/dL (ref 30.0–36.0)
MCV: 88.5 fL (ref 80.0–100.0)
Platelets: 309 10*3/uL (ref 150–400)
RBC: 4.08 MIL/uL (ref 3.87–5.11)
RDW: 13.5 % (ref 11.5–15.5)
WBC: 15.1 10*3/uL — ABNORMAL HIGH (ref 4.0–10.5)
nRBC: 0 % (ref 0.0–0.2)

## 2021-02-16 MED ORDER — ACETAMINOPHEN 500 MG PO TABS
1000.0000 mg | ORAL_TABLET | Freq: Four times a day (QID) | ORAL | Status: DC
  Administered 2021-02-16 – 2021-02-20 (×12): 1000 mg via ORAL
  Filled 2021-02-16 (×14): qty 2

## 2021-02-16 MED ORDER — METHOCARBAMOL 1000 MG/10ML IJ SOLN
1000.0000 mg | Freq: Three times a day (TID) | INTRAVENOUS | Status: DC
  Administered 2021-02-16 – 2021-02-18 (×6): 1000 mg via INTRAVENOUS
  Filled 2021-02-16 (×4): qty 10
  Filled 2021-02-16: qty 1000
  Filled 2021-02-16 (×2): qty 10
  Filled 2021-02-16: qty 1000

## 2021-02-16 MED ORDER — HYDROMORPHONE HCL 1 MG/ML IJ SOLN
1.0000 mg | Freq: Once | INTRAMUSCULAR | Status: AC
  Administered 2021-02-16: 1 mg via INTRAMUSCULAR
  Filled 2021-02-16: qty 1

## 2021-02-16 NOTE — Progress Notes (Addendum)
PROGRESS NOTE  Wendy Cooper  RDE:[id number] DOB: 1971-08-09 DOA: 02/11/2021 PCP: Clemencia Course, PA-C   Brief Narrative: Wendy Cooper is a 50 y.o. female with a history of colitis, HTN, and anxiety who presented to the ED at [facility] initially 6/6 for severe abdominal pain. She left after 7 hours without being seen only to return the next day to [facility] for the same complaint. She was found to have leukocytosis (WBC 19.5k) and developed a fever to 101.22F after several hours in the waiting room and was taken for CT abd/pelvis which demonstrated sigmoid colon perforation with walled off 1.1 - 1.3 cm air collection. She was started on zosyn, IV fluids, IV analgesics, and surgery was consulted. Due to persistent evidence of peritonitis, operative management will be pursued 6/10.   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 Derrell Lolling.  - Continue zosyn for 5 more days per surgery - Postop diet (NPO with sips) and analgesia (multimodal including dilaudid PCA) per surgery.   HTN:  - Restart lisinopril/HCTZ home med as BP is now rising as it is at baseline.   Anxiety, depression:  - Xanax changed to ativan as pt is NPO - Restarted home cymbalta if able to take per surgery.  Tobacco use:  - Nicotine patch - Cessation counseling to be provided prior to discharge.   Hypokalemia:  - Continue supplementation in balanced IVF.  Insomnia:  - Restarted remeron (disintegrating tab)  Obesity: Estimated body mass index is 34.97 kg/m as calculated from the following:   Height as of 02/10/21: 5\' 8"  (1.727 m).   Weight as of 02/10/21: 104.3 kg.  DVT prophylaxis: Lovenox 40mg  q24h (ok per surgery) Code Status: Full Family Communication: None at bedside Disposition  Plan:  Status is: Inpatient  Remains inpatient appropriate because:Ongoing active pain requiring inpatient pain management and IV treatments appropriate due to intensity of illness or inability to take PO   Dispo: The patient is from: Home              Anticipated d/c is to: Home              Patient currently is not medically stable to d/c.   Difficult to place patient No  Consultants:  General surgery  Procedures:  Colectomy/colostomy 02/14/2021, Dr.  Antimicrobials: Zosyn 6/7 >>   Subjective: Pain stable, improved with PCA. Walks in room. Very frustrated with NPO status.  Objective: Vitals:   02/16/21 0929 02/16/21 0938 02/16/21 1409 02/16/21 1415  BP:  (!) 144/86  122/90  Pulse:    92  Resp: 11   14  Temp:    97.8 F (36.6 C)  TempSrc:    Oral  SpO2: 95%  94% 96%    Intake/Output Summary (Last 24 hours) at 02/16/2021 1623 Last data filed at 02/16/2021 1418 Gross per 24 hour  Intake 2683.02 ml  Output 430 ml  Net 2253.02 ml   Gen: 50 y.o. female in no distress Pulm: Nonlabored breathing room air. Clear. CV: Regular rate and rhythm. No murmur, rub, or gallop. No JVD, no dependent edema. GI: Abdomen distended, tender, hypoactive bowel sounds.  Ext: Warm, no deformities Skin: Surgical incision site c/d/I. Neuro: Alert and oriented. No focal neurological deficits. Psych: Judgement and insight appear fair. Mood euthymic &  affect congruent. Behavior is appropriate.    Data Reviewed: I have personally reviewed following labs and imaging studies  CBC: Recent Labs  Lab 02/12/21 0357 02/13/21 0800 02/14/21 0128 02/15/21 0109 02/16/21 0510  WBC 17.0* 11.4* 9.1 13.2* 15.1*  NEUTROABS  --  8.3* 5.4  --   --   HGB 12.0 11.0* 11.0* 12.3 11.9*  HCT 36.6 33.0* 33.0* 36.8 36.1  MCV 89.5 88.0 88.7 88.0 88.5  PLT 190 208 232 287 309   Basic Metabolic Panel: Recent Labs  Lab 02/11/21 1315 02/12/21 0357 02/14/21 0128 02/15/21 0109 02/16/21 0510  NA 135  136 140 139 137  K 3.6 3.4* 3.3* 4.1 3.8  CL 103 102 106 107 104  CO2 22 23 27 22 25   GLUCOSE 107* 102* 95 126* 104*  BUN 13 9 5* <5* <5*  CREATININE 0.80 0.74 0.84 0.73 0.82  CALCIUM 9.3 9.0 8.8* 8.9 8.8*  MG  --   --   --  1.8  --    GFR: Estimated Creatinine Clearance: 103.8 mL/min (by C-G formula based on SCr of 0.82 mg/dL). Liver Function Tests: Recent Labs  Lab 02/10/21 1816 02/11/21 1315 02/14/21 0128  AST 15 13* 14*  ALT 13 11 13   ALKPHOS 67 62 56  BILITOT 0.4 0.9 0.6  PROT 7.4 7.1 5.8*  ALBUMIN 4.0 3.5 2.6*   Recent Labs  Lab 02/10/21 1816 02/11/21 1315  LIPASE 28 20   Urine analysis:    Component Value Date/Time   COLORURINE YELLOW 02/11/2021 1413   APPEARANCEUR HAZY (A) 02/11/2021 1413   LABSPEC 1.017 02/11/2021 1413   PHURINE 6.0 02/11/2021 1413   GLUCOSEU NEGATIVE 02/11/2021 1413   HGBUR SMALL (A) 02/11/2021 1413   BILIRUBINUR NEGATIVE 02/11/2021 1413   KETONESUR NEGATIVE 02/11/2021 1413   PROTEINUR NEGATIVE 02/11/2021 1413   NITRITE NEGATIVE 02/11/2021 1413   LEUKOCYTESUR TRACE (A) 02/11/2021 1413   Recent Results (from the past 240 hour(s))  Resp Panel by RT-PCR (Flu A&B, Covid) Nasopharyngeal Swab     Status: None   Collection Time: 02/11/21  9:26 PM   Specimen: Nasopharyngeal Swab; Nasopharyngeal(NP) swabs in vial transport medium  Result Value Ref Range Status   SARS Coronavirus 2 by RT PCR NEGATIVE NEGATIVE Final    Comment: (NOTE) SARS-CoV-2 target nucleic acids are NOT DETECTED.  The SARS-CoV-2 RNA is generally detectable in upper respiratory specimens during the acute phase of infection. The lowest concentration of SARS-CoV-2 viral copies this assay can detect is 138 copies/mL. A negative result does not preclude SARS-Cov-2 infection and should not be used as the sole basis for treatment or other patient management decisions. A negative result may occur with  improper specimen collection/handling, submission of specimen other than  nasopharyngeal swab, presence of viral mutation(s) within the areas targeted by this assay, and inadequate number of viral copies(<138 copies/mL). A negative result must be combined with clinical observations, patient history, and epidemiological information. The expected result is Negative.  Fact Sheet for Patients:  04/13/2021  Fact Sheet for Healthcare Providers:  04/13/21  This test is no t yet approved or cleared by the [employer] FDA and  has been authorized for detection and/or diagnosis of SARS-CoV-2 by FDA under an Emergency Use Authorization (EUA). This EUA will remain  in effect (meaning this test can be used) for the duration of the COVID-19 declaration under Section 564(b)(1) of the Act, 21 U.S.C.section 360bbb-3(b)(1), unless the authorization is terminated  or revoked sooner.  Influenza A by PCR NEGATIVE NEGATIVE Final   Influenza B by PCR NEGATIVE NEGATIVE Final    Comment: (NOTE) The Xpert Xpress SARS-CoV-2/FLU/RSV plus assay is intended as an aid in the diagnosis of influenza from Nasopharyngeal swab specimens and should not be used as a sole basis for treatment. Nasal washings and aspirates are unacceptable for Xpert Xpress SARS-CoV-2/FLU/RSV testing.  Fact Sheet for Patients: BloggerCourse.com  Fact Sheet for Healthcare Providers: SeriousBroker.it  This test is not yet approved or cleared by the [place] FDA and has been authorized for detection and/or diagnosis of SARS-CoV-2 by FDA under an Emergency Use Authorization (EUA). This EUA will remain in effect (meaning this test can be used) for the duration of the COVID-19 declaration under Section 564(b)(1) of the Act, 21 U.S.C. section 360bbb-3(b)(1), unless the authorization is terminated or revoked.  Performed at [facility], [address], [place], [place] [address]        Radiology Studies: No results found.   Scheduled Meds:  acetaminophen  1,000 mg Oral Q6H   Chlorhexidine Gluconate Cloth  6 each Topical Daily   DULoxetine  20 mg Oral Daily   enoxaparin (LOVENOX) injection  40 mg Subcutaneous Q24H   hydrochlorothiazide  12.5 mg Oral Daily   HYDROmorphone   Intravenous Q4H   lisinopril  20 mg Oral Daily   mirtazapine  30 mg Oral QHS   nicotine  7 mg Transdermal Daily   Continuous Infusions:  dextrose 5 % and 0.45 % NaCl with KCl 20 mEq/L 20 mL/hr at 02/16/21 0639   methocarbamol (ROBAXIN) IV 1,000 mg (02/16/21 1408)   piperacillin-tazobactam (ZOSYN)  IV 3.375 g (02/16/21 1422)     LOS: 5 days   Time spent: 25 minutes.  Tyrone Nine, MD [facility] [email or web address] 02/16/2021, 4:23 PM

## 2021-02-16 NOTE — Progress Notes (Signed)
2 Days Post-Op Hartman's procedure Subjective: Pain controlled relatively well with PCA, no nausea, no bowel fuction, ambulated in the room yesterday  Objective: Vital signs in last 24 hours: Temp:  [97.7 F (36.5 C)-98.7 F (37.1 C)] 98.7 F (37.1 C) (06/12 0657) Pulse Rate:  [74-97] 97 (06/12 0657) Resp:  [17-18] 18 (06/12 0657) BP: (108-166)/(71-105) 166/105 (06/12 0657) SpO2:  [93 %-100 %] 93 % (06/12 0657) FiO2 (%):  [38 %-39 %] 39 % (06/12 0604)   Intake/Output from previous day: 06/11 0701 - 06/12 0700 In: 1783.8 [P.O.:150; I.V.:1177.1; IV Piggyback:456.7] Out: 430 [Urine:400; Stool:30] Intake/Output this shift: No intake/output data recorded.   General appearance: alert and cooperative GI: soft, distended  Incision: dressing clean, slight bloody drainage  Lab Results:  Recent Labs    02/15/21 0109 02/16/21 0510  WBC 13.2* 15.1*  HGB 12.3 11.9*  HCT 36.8 36.1  PLT 287 309    BMET Recent Labs    02/15/21 0109 02/16/21 0510  NA 139 137  K 4.1 3.8  CL 107 104  CO2 22 25  GLUCOSE 126* 104*  BUN <5* <5*  CREATININE 0.73 0.82  CALCIUM 8.9 8.8*    PT/INR No results for input(s): LABPROT, INR in the last 72 hours. ABG No results for input(s): PHART, HCO3 in the last 72 hours.  Invalid input(s): PCO2, PO2  MEDS, Scheduled  acetaminophen  1,000 mg Oral Q6H   Chlorhexidine Gluconate Cloth  6 each Topical Daily   DULoxetine  20 mg Oral Daily   enoxaparin (LOVENOX) injection  40 mg Subcutaneous Q24H   hydrochlorothiazide  12.5 mg Oral Daily   HYDROmorphone   Intravenous Q4H   lisinopril  20 mg Oral Daily   mirtazapine  30 mg Oral QHS   nicotine  7 mg Transdermal Daily    Studies/Results: No results found.  Assessment: s/p Procedure(s): EXPLORATORY LAPAROTOMY PARTIAL COLECTOMY AND COLOSTOMY Patient Active Problem List   Diagnosis Date Noted   Diverticulitis of colon with perforation 02/11/2021   Leukocytosis 02/11/2021   Anxiety  02/11/2021   Tobacco use 02/11/2021   Essential hypertension 12/20/2019    S/p open Hartman's procedure: 6/10 Dr Derrell Lolling  Plan: Cont PCA  for pain control Ambulate in [facility] for sips of clears Cont Zosyn for 5 more days Await return of bowel function  LOS: 5 days     .Vanita Panda, MD [facility], [place]    02/16/2021 7:54 AM

## 2021-02-17 LAB — BASIC METABOLIC PANEL
Anion gap: 12 (ref 5–15)
BUN: 5 mg/dL — ABNORMAL LOW (ref 6–20)
CO2: 25 mmol/L (ref 22–32)
Calcium: 9.3 mg/dL (ref 8.9–10.3)
Chloride: 100 mmol/L (ref 98–111)
Creatinine, Ser: 0.83 mg/dL (ref 0.44–1.00)
GFR, Estimated: >60 mL/min (ref >60)
Glucose, Bld: 106 mg/dL — ABNORMAL HIGH (ref 70–99)
Potassium: 3.7 mmol/L (ref 3.5–5.1)
Sodium: 137 mmol/L (ref 135–145)

## 2021-02-17 LAB — CBC
HCT: 34.5 % — ABNORMAL LOW (ref 36.0–46.0)
Hemoglobin: 11.3 g/dL — ABNORMAL LOW (ref 12.0–15.0)
MCH: 28.8 pg (ref 26.0–34.0)
MCHC: 32.8 g/dL (ref 30.0–36.0)
MCV: 87.8 fL (ref 80.0–100.0)
Platelets: 377 10*3/uL (ref 150–400)
RBC: 3.93 MIL/uL (ref 3.87–5.11)
RDW: 13.4 % (ref 11.5–15.5)
WBC: 17.8 10*3/uL — ABNORMAL HIGH (ref 4.0–10.5)
nRBC: 0 % (ref 0.0–0.2)

## 2021-02-17 LAB — SURGICAL PATHOLOGY

## 2021-02-17 NOTE — Progress Notes (Signed)
PROGRESS NOTE  Wendy Cooper  TKP:[id number] DOB: 1971/08/09 DOA: 02/11/2021 PCP: Clemencia Course, PA-C   Brief Narrative: Wendy Cooper is a 50 y.o. female with a history of colitis, HTN, and anxiety who presented to the ED at [facility] initially 6/6 for severe abdominal pain. She left after 7 hours without being seen only to return the next day to [facility] for the same complaint. She was found to have leukocytosis (WBC 19.5k) and developed a fever to 101.62F after several hours in the waiting room and was taken for CT abd/pelvis which demonstrated sigmoid colon perforation with walled off 1.1 - 1.3 cm air collection. She was started on zosyn, IV fluids, IV analgesics, and surgery was consulted. Due to persistent evidence of peritonitis, operative management will be pursued 6/10.   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 Derrell Lolling.  - Continue zosyn per surgery. - Postop diet (NPO with sips of clears) and analgesia (multimodal including dilaudid PCA) per surgery.   HTN:  - Continue home lisinopril/HCTZ   Anxiety, depression:  - Xanax changed to ativan to bypass questionable absorption. - Restarted home cymbalta if able to take   Tobacco use:  - Nicotine patch - Cessation counseling to be provided prior to discharge.   Hypokalemia: Resolved. - Continue supplementation in balanced IVF.  Insomnia:  - Restarted remeron (disintegrating tab)  Obesity: Estimated body mass index is 34.97 kg/m as calculated from the following:   Height as of 02/10/21: 5\' 8"  (1.727 m).   Weight as of 02/10/21: 104.3 kg.  DVT prophylaxis: Lovenox 40mg  q24h (ok per surgery) Code Status: Full Family Communication: None at bedside Disposition Plan:  Status is:  Inpatient  Remains inpatient appropriate because:Ongoing active pain requiring inpatient pain management and IV treatments appropriate due to intensity of illness or inability to take PO   Dispo: The patient is from: Home              Anticipated d/c is to: Home              Patient currently is not medically stable to d/c.   Difficult to place patient No  Consultants:  General surgery  Procedures:  Colectomy/colostomy 02/14/2021, Dr.  Antimicrobials: Zosyn 6/7 >>   Subjective: Pain much better for the first time today. Tolerating sips of clears. No bowel function yet.   Objective: Vitals:   02/17/21 0849 02/17/21 0934 02/17/21 1208 02/17/21 1414  BP:  (!) 149/97  108/75  Pulse:  94  73  Resp: 16 20 20 18   Temp:  97.6 F (36.4 C)  97.9 F (36.6 C)  TempSrc:  Oral  Oral  SpO2: 94% 99% 97% 95%    Intake/Output Summary (Last 24 hours) at 02/17/2021 1623 Last data filed at 02/17/2021 1300 Gross per 24 hour  Intake 2533.7 ml  Output 30 ml  Net 2503.7 ml   Gen: 50 y.o. female in no distress Pulm: Nonlabored breathing room air. Clear. CV: Regular rate and rhythm. No murmur, rub, or gallop. No JVD, no dependent edema. GI: Abdomen soft, improved tenderness without rebound or guarding, non-distended, with normoactive bowel sounds.  Ext: Warm, no deformities Skin: No new rashes, lesions or ulcers on visualized skin. LMQ w/colostomy, appearing healthy, no stool. Neuro: Alert and oriented. No focal  neurological deficits. Psych: Judgement and insight appear fair. Mood euthymic & affect congruent. Behavior is appropriate.    Data Reviewed: I have personally reviewed following labs and imaging studies  CBC: Recent Labs  Lab 02/13/21 0800 02/14/21 0128 02/15/21 0109 02/16/21 0510 02/17/21 1137  WBC 11.4* 9.1 13.2* 15.1* 17.8*  NEUTROABS 8.3* 5.4  --   --   --   HGB 11.0* 11.0* 12.3 11.9* 11.3*  HCT 33.0* 33.0* 36.8 36.1 34.5*  MCV 88.0 88.7 88.0 88.5 87.8  PLT  208 232 287 309 377   Basic Metabolic Panel: Recent Labs  Lab 02/12/21 0357 02/14/21 0128 02/15/21 0109 02/16/21 0510 02/17/21 1137  NA 136 140 139 137 137  K 3.4* 3.3* 4.1 3.8 3.7  CL 102 106 107 104 100  CO2 23 27 22 25 25   GLUCOSE 102* 95 126* 104* 106*  BUN 9 5* <5* <5* 5*  CREATININE 0.74 0.84 0.73 0.82 0.83  CALCIUM 9.0 8.8* 8.9 8.8* 9.3  MG  --   --  1.8  --   --    GFR: Estimated Creatinine Clearance: 102.5 mL/min (by C-G formula based on SCr of 0.83 mg/dL). Liver Function Tests: Recent Labs  Lab 02/10/21 1816 02/11/21 1315 02/14/21 0128  AST 15 13* 14*  ALT 13 11 13   ALKPHOS 67 62 56  BILITOT 0.4 0.9 0.6  PROT 7.4 7.1 5.8*  ALBUMIN 4.0 3.5 2.6*   Recent Labs  Lab 02/10/21 1816 02/11/21 1315  LIPASE 28 20   Urine analysis:    Component Value Date/Time   COLORURINE YELLOW 02/11/2021 1413   APPEARANCEUR HAZY (A) 02/11/2021 1413   LABSPEC 1.017 02/11/2021 1413   PHURINE 6.0 02/11/2021 1413   GLUCOSEU NEGATIVE 02/11/2021 1413   HGBUR SMALL (A) 02/11/2021 1413   BILIRUBINUR NEGATIVE 02/11/2021 1413   KETONESUR NEGATIVE 02/11/2021 1413   PROTEINUR NEGATIVE 02/11/2021 1413   NITRITE NEGATIVE 02/11/2021 1413   LEUKOCYTESUR TRACE (A) 02/11/2021 1413   Recent Results (from the past 240 hour(s))  Resp Panel by RT-PCR (Flu A&B, Covid) Nasopharyngeal Swab     Status: None   Collection Time: 02/11/21  9:26 PM   Specimen: Nasopharyngeal Swab; Nasopharyngeal(NP) swabs in vial transport medium  Result Value Ref Range Status   SARS Coronavirus 2 by RT PCR NEGATIVE NEGATIVE Final    Comment: (NOTE) SARS-CoV-2 target nucleic acids are NOT DETECTED.  The SARS-CoV-2 RNA is generally detectable in upper respiratory specimens during the acute phase of infection. The lowest concentration of SARS-CoV-2 viral copies this assay can detect is 138 copies/mL. A negative result does not preclude SARS-Cov-2 infection and should not be used as the sole basis for treatment  or other patient management decisions. A negative result may occur with  improper specimen collection/handling, submission of specimen other than nasopharyngeal swab, presence of viral mutation(s) within the areas targeted by this assay, and inadequate number of viral copies(<138 copies/mL). A negative result must be combined with clinical observations, patient history, and epidemiological information. The expected result is Negative.  Fact Sheet for Patients:  04/13/2021  Fact Sheet for Healthcare Providers:  04/13/21  This test is no t yet approved or cleared by the [employer] FDA and  has been authorized for detection and/or diagnosis of SARS-CoV-2 by FDA under an Emergency Use Authorization (EUA). This EUA will remain  in effect (meaning this test can be used) for the duration of the COVID-19 declaration under Section 564(b)(1) of the Act, 21 U.S.C.section 360bbb-3(b)(1), unless  the authorization is terminated  or revoked sooner.       Influenza A by PCR NEGATIVE NEGATIVE Final   Influenza B by PCR NEGATIVE NEGATIVE Final    Comment: (NOTE) The Xpert Xpress SARS-CoV-2/FLU/RSV plus assay is intended as an aid in the diagnosis of influenza from Nasopharyngeal swab specimens and should not be used as a sole basis for treatment. Nasal washings and aspirates are unacceptable for Xpert Xpress SARS-CoV-2/FLU/RSV testing.  Fact Sheet for Patients: [employer]  Fact Sheet for Healthcare Providers: Serious[employer]ker.it  This test is not yet approved or cleared by the [place] FDA and has been authorized for detection and/or diagnosis of SARS-CoV-2 by FDA under an Emergency Use Authorization (EUA). This EUA will remain in effect (meaning this test can be used) for the duration of the COVID-19 declaration under Section 564(b)(1) of the Act, 21 U.S.C. section  360bbb-3(b)(1), unless the authorization is terminated or revoked.  Performed at [facility], [address] [place], [place] [address]       Radiology Studies: No results found.   Scheduled Meds:  acetaminophen  1,000 mg Oral Q6H   Chlorhexidine Gluconate Cloth  6 each Topical Daily   DULoxetine  20 mg Oral Daily   enoxaparin (LOVENOX) injection  40 mg Subcutaneous Q24H   hydrochlorothiazide  12.5 mg Oral Daily   HYDROmorphone   Intravenous Q4H   lisinopril  20 mg Oral Daily   mirtazapine  30 mg Oral QHS   nicotine  7 mg Transdermal Daily   Continuous Infusions:  dextrose 5 % and 0.45 % NaCl with KCl 20 mEq/L 100 mL/hr at 02/17/21 0652   methocarbamol (ROBAXIN) IV Stopped (02/17/21 0623)   piperacillin-tazobactam (ZOSYN)  IV 12.5 mL/hr at 02/17/21 0652     LOS: 6 days   Time spent: 25 minutes.  Tyrone Nine, MD [facility] [email or web address] 02/17/2021, 4:23 PM

## 2021-02-17 NOTE — Consult Note (Signed)
WOC Nurse ostomy consult note Stoma type/location: LMQ colostomy Stomal assessment/size: 1 1/4" pink and moist  Peristomal assessment: intact  abdomen edematous Treatment options for stomal/peristomal skin: barrier ring and 2piece pouch Output blood tinged liquid only Ostomy pouching: 2pc.  2 3/4" pouch with barrier ring Education provided: pouch change performed.  Patient refuses to look at stoma.  She cut the barrier opening and snapped the 2 pieces together.  She has carpal tunnel and states that cutting the barrier is difficult.  Informed her that she would get curved scissors and that may help. I applied barrier ring, explaining rationale and pouch.  Demonstrated emptying and verbalized understanding.   Enrolled patient in [employer] DC program: Yes will today.  Will not follow at this time.  Please re-consult if needed.  Maple Hudson MSN, RN, FNP-BC CWON Wound, Ostomy, Continence Nurse Pager [phone]

## 2021-02-17 NOTE — Progress Notes (Signed)
Progress Note  3 Days Post-Op  Subjective: CC: having gas pain this morning otherwise pain well controlled with PCA. Thin serous liquid in bag on exam but she states she has had gas output as well. No nausea, emesis. Ambulating. Denies respiratory complaints   Objective: Vital signs in last 24 hours: Temp:  [97.8 F (36.6 C)-98.5 F (36.9 C)] 98.3 F (36.8 C) (06/13 0559) Pulse Rate:  [85-93] 93 (06/13 0559) Resp:  [11-18] 18 (06/13 0559) BP: (122-158)/(86-97) 158/97 (06/13 0559) SpO2:  [93 %-100 %] 93 % (06/13 0559) FiO2 (%):  [0 %-100 %] 100 % (06/12 0929) Last BM Date: 02/13/21  Intake/Output from previous day: 06/12 0701 - 06/13 0700 In: 3362.9 [P.O.:1070; I.V.:1826.8; IV Piggyback:466.2] Out: 30 [Stool:30] Intake/Output this shift: No intake/output data recorded.  PE: General: pleasant, WD, female who is laying in bed in NAD HEENT: head is normocephalic, atraumatic.   Mouth is pink and moist Heart: Palpable radial pulses - no cyanosis Lungs: Respiratory effort nonlabored Abd: soft, +BS, mildly distended. Mild to moderate TTP worst in bilateral lower quadrants. Staples intact and no discharge at incision via inspection through honeycomb bandage. Colostomy with pink serous liquid output MS: all 4 extremities are symmetrical with no cyanosis, clubbing, or edema. Skin: warm and dry with no masses, lesions, or rashes Psych: A&Ox3 with an appropriate affect.    Lab Results:  Recent Labs    02/15/21 0109 02/16/21 0510  WBC 13.2* 15.1*  HGB 12.3 11.9*  HCT 36.8 36.1  PLT 287 309   BMET Recent Labs    02/15/21 0109 02/16/21 0510  NA 139 137  K 4.1 3.8  CL 107 104  CO2 22 25  GLUCOSE 126* 104*  BUN <5* <5*  CREATININE 0.73 0.82  CALCIUM 8.9 8.8*   PT/INR No results for input(s): LABPROT, INR in the last 72 hours. CMP     Component Value Date/Time   NA 137 02/16/2021 0510   K 3.8 02/16/2021 0510   CL 104 02/16/2021 0510   CO2 25 02/16/2021 0510    GLUCOSE 104 (H) 02/16/2021 0510   BUN <5 (L) 02/16/2021 0510   CREATININE 0.82 02/16/2021 0510   CALCIUM 8.8 (L) 02/16/2021 0510   PROT 5.8 (L) 02/14/2021 0128   ALBUMIN 2.6 (L) 02/14/2021 0128   AST 14 (L) 02/14/2021 0128   ALT 13 02/14/2021 0128   ALKPHOS 56 02/14/2021 0128   BILITOT 0.6 02/14/2021 0128   GFRNONAA >60 02/16/2021 0510   Lipase     Component Value Date/Time   LIPASE 20 02/11/2021 1315       Studies/Results: No results found.  Anti-infectives: Anti-infectives (From admission, onward)    Start     Dose/Rate Route Frequency Ordered Stop   02/13/21 1015  valACYclovir (VALTREX) tablet 500 mg        500 mg Oral Daily 02/13/21 0921 02/15/21 0957   02/12/21 0600  piperacillin-tazobactam (ZOSYN) IVPB 3.375 g        3.375 g 12.5 mL/hr over 240 Minutes Intravenous Every 8 hours 02/11/21 2317 02/20/21 0851   02/11/21 2315  piperacillin-tazobactam (ZOSYN) IVPB 3.375 g  Status:  Discontinued        3.375 g 12.5 mL/hr over 240 Minutes Intravenous Every 8 hours 02/11/21 2313 02/11/21 2317   02/11/21 2130  piperacillin-tazobactam (ZOSYN) IVPB 3.375 g        3.375 g 100 mL/hr over 30 Minutes Intravenous  Once 02/11/21 2129 02/11/21 2356  Assessment/Plan Perforated diverticulitis POD3 s/p open Hartman's procedure - Dr. Derrell Lolling 6/10 - PCA for pain control - transition to PO/IV once bowel function returns - sips of clears - zosyn - end date 6/17 - await return of bowel function - some gas today  FEN: NPO w/ sips, IVF ID: zosyn 6/7>> VTE: lovenox    LOS: 6 days    Eric Form, [facility] 02/17/2021, 7:39 AM Please see Amion for pager number during day hours 7:00am-4:30pm

## 2021-02-18 LAB — BASIC METABOLIC PANEL
Anion gap: 8 (ref 5–15)
BUN: <5 mg/dL — ABNORMAL LOW (ref 6–20)
CO2: 24 mmol/L (ref 22–32)
Calcium: 8.8 mg/dL — ABNORMAL LOW (ref 8.9–10.3)
Chloride: 105 mmol/L (ref 98–111)
Creatinine, Ser: 0.76 mg/dL (ref 0.44–1.00)
GFR, Estimated: >60 mL/min (ref >60)
Glucose, Bld: 117 mg/dL — ABNORMAL HIGH (ref 70–99)
Potassium: 3.6 mmol/L (ref 3.5–5.1)
Sodium: 137 mmol/L (ref 135–145)

## 2021-02-18 MED ORDER — ADULT MULTIVITAMIN W/MINERALS CH
1.0000 | ORAL_TABLET | Freq: Every day | ORAL | Status: DC
  Administered 2021-02-18 – 2021-02-20 (×3): 1 via ORAL
  Filled 2021-02-18 (×3): qty 1

## 2021-02-18 MED ORDER — OXYCODONE HCL 5 MG PO TABS
5.0000 mg | ORAL_TABLET | ORAL | Status: DC | PRN
  Administered 2021-02-18 – 2021-02-20 (×8): 10 mg via ORAL
  Filled 2021-02-18 (×8): qty 2

## 2021-02-18 MED ORDER — MORPHINE SULFATE (PF) 2 MG/ML IV SOLN
2.0000 mg | INTRAVENOUS | Status: DC | PRN
  Administered 2021-02-18 – 2021-02-20 (×8): 2 mg via INTRAVENOUS
  Filled 2021-02-18 (×8): qty 1

## 2021-02-18 MED ORDER — ALPRAZOLAM 0.5 MG PO TABS
0.5000 mg | ORAL_TABLET | Freq: Three times a day (TID) | ORAL | Status: DC | PRN
  Administered 2021-02-18 – 2021-02-20 (×5): 0.5 mg via ORAL
  Filled 2021-02-18 (×7): qty 1

## 2021-02-18 MED ORDER — METHOCARBAMOL 500 MG PO TABS: 1000.0000 mg | ORAL_TABLET | Freq: Three times a day (TID) | ORAL | Status: DC | PRN

## 2021-02-18 MED ORDER — BOOST / RESOURCE BREEZE PO LIQD CUSTOM
1.0000 | Freq: Three times a day (TID) | ORAL | Status: DC
  Administered 2021-02-18: 1 via ORAL

## 2021-02-18 MED ORDER — ENSURE ENLIVE PO LIQD
237.0000 mL | Freq: Two times a day (BID) | ORAL | Status: DC
  Administered 2021-02-18 – 2021-02-20 (×5): 237 mL via ORAL

## 2021-02-18 NOTE — Progress Notes (Signed)
Progress Note  4 Days Post-Op  Subjective: CC: she did not sleep well last night but otherwise no new complaints. Continues to have gas from colostomy and intermittent gas pain. Well controlled abdominal pain. Ambulating. No respiratory or urinary complaints.  Objective: Vital signs in last 24 hours: Temp:  [97.6 F (36.4 C)-98.5 F (36.9 C)] 97.6 F (36.4 C) (06/14 0421) Pulse Rate:  [72-94] 74 (06/14 0421) Resp:  [13-20] 17 (06/14 0421) BP: (105-149)/(59-97) 123/62 (06/14 0421) SpO2:  [92 %-99 %] 96 % (06/14 0421) FiO2 (%):  [39 %-100 %] 39 % (06/13 2153) Last BM Date: 02/13/21  Intake/Output from previous day: 06/13 0701 - 06/14 0700 In: 2345 [P.O.:630; I.V.:1399; IV Piggyback:316] Out: 0  Intake/Output this shift: No intake/output data recorded.  PE: General: pleasant, WD, female who is laying in bed in NAD HEENT: head is normocephalic, atraumatic.   Mouth is pink and moist Heart: Palpable radial pulses - no cyanosis Lungs: Respiratory effort nonlabored Abd: soft, +BS, mildly distended. Mild TTP worst in bilateral lower quadrants. Staples intact with old blood and small amount of bloody serous drainage. No palpable fluid collections along incision. Colostomy with scant pink serous liquid output and small amount of air MS: all 4 extremities are symmetrical with no cyanosis, clubbing, or edema. No calf TTP Skin: warm and dry with no masses, lesions, or rashes Psych: A&Ox3 with an appropriate affect.     Lab Results:  Recent Labs    02/16/21 0510 02/17/21 1137  WBC 15.1* 17.8*  HGB 11.9* 11.3*  HCT 36.1 34.5*  PLT 309 377    BMET Recent Labs    02/17/21 1137 02/18/21 0606  NA 137 137  K 3.7 3.6  CL 100 105  CO2 25 24  GLUCOSE 106* 117*  BUN 5* <5*  CREATININE 0.83 0.76  CALCIUM 9.3 8.8*    PT/INR No results for input(s): LABPROT, INR in the last 72 hours. CMP     Component Value Date/Time   NA 137 02/18/2021 0606   K 3.6 02/18/2021 0606   CL  105 02/18/2021 0606   CO2 24 02/18/2021 0606   GLUCOSE 117 (H) 02/18/2021 0606   BUN <5 (L) 02/18/2021 0606   CREATININE 0.76 02/18/2021 0606   CALCIUM 8.8 (L) 02/18/2021 0606   PROT 5.8 (L) 02/14/2021 0128   ALBUMIN 2.6 (L) 02/14/2021 0128   AST 14 (L) 02/14/2021 0128   ALT 13 02/14/2021 0128   ALKPHOS 56 02/14/2021 0128   BILITOT 0.6 02/14/2021 0128   GFRNONAA >60 02/18/2021 0606   Lipase     Component Value Date/Time   LIPASE 20 02/11/2021 1315       Studies/Results: No results found.  Anti-infectives: Anti-infectives (From admission, onward)    Start     Dose/Rate Route Frequency Ordered Stop   02/13/21 1015  valACYclovir (VALTREX) tablet 500 mg        500 mg Oral Daily 02/13/21 0921 02/15/21 0957   02/12/21 0600  piperacillin-tazobactam (ZOSYN) IVPB 3.375 g        3.375 g 12.5 mL/hr over 240 Minutes Intravenous Every 8 hours 02/11/21 2317 02/20/21 0851   02/11/21 2315  piperacillin-tazobactam (ZOSYN) IVPB 3.375 g  Status:  Discontinued        3.375 g 12.5 mL/hr over 240 Minutes Intravenous Every 8 hours 02/11/21 2313 02/11/21 2317   02/11/21 2130  piperacillin-tazobactam (ZOSYN) IVPB 3.375 g        3.375 g 100 mL/hr over 30 Minutes  Intravenous  Once 02/11/21 2129 02/11/21 2356        Assessment/Plan Perforated diverticulitis POD4 s/p open Hartman's procedure - Dr. Derrell Lolling 6/10 - PCA for pain control - transition to PO/IV once bowel function returns - zosyn - end date 6/17 - await return of bowel function - some gas today - start clears with ensure breeze. Pending tolerance and intake need to consider TPN given >6 days without PO intake - WBC 17.8 yesterday, monitor  - ambulate - BID dressing changes  FEN: clears, breeze, IVF ID: zosyn 6/7>> VTE: lovenox    LOS: 7 days    Eric Form, [facility] 02/18/2021, 7:45 AM Please see Amion for pager number during day hours 7:00am-4:30pm

## 2021-02-18 NOTE — Progress Notes (Signed)
Pt had several hard balls of stool which came through her stoma today. We cleaned them out.  Stoma is beefy red in color. Clear liquid diet initiated and pt tolerated this very well. She would love solids when able. Got Xanax renewed (home med which she takes 2mg  twice daily) and explained that she would need to ask for this when needed because it is PRN (0.5mg ). PCA Dilaudid changed to PO pain med with Morphine for breakthrough pain. Explained to pt how to clean and change incisional dressing and how to use sting free barrier for the ostomy change and the area around the incision where the tape would be. Talked about 2 piece ostomy system and how it clicks into place like Tupperware. Showed pt how to open/close the end of the bag.  Pt tearful earlier stating that her son's dad had died 2 months ago and that she has had a lot going on recently. Her other son, age 58, lives in [place] with her ex husband and this upsets her also. I encouraged her that this will get better and to try and focus on getting better now for her sons. She stated that the doctors said the ostomy would be reversed in around 4 months and I told her that was very positive.

## 2021-02-18 NOTE — TOC Initial Note (Addendum)
Transition of Care [facility]) - Initial/Assessment Note    Patient Details  Name: Wendy Cooper MRN: [id number] Date of Birth: 1971/03/19  [facility] CM/SW Contact:    Kingsley Plan, RN Phone Number: 02/18/2021, 11:33 AM  Clinical Narrative:                 Spoke to patient at bedside. Confirmed face sheet information.   Patient from home with her best friend and his mother.   Discussed home health RN. Patient aware home health RN will be present daily , and she and support person will be taught wound care and ostomy care prior to discharge. Patient voiced understanding.   Patient was enrolled in [place] [facility] Program by [facility] yesterday.   Spoke to [place] PA with [facility], possible discharge Thursday or Friday.  [place] : Spoke with [facility] they are out of network with patient's insurance.   Spoke with Elnita Maxwell with [facility] they are out of network with patient's insurance.     Amy with [facility] is out of network with patient's insurance.    Stacie with [facility] out of network with patient's insurance.   Misty Stanley with [facility] declined due to patient's insurance.   Eber Jones with [facility] Reviewing. Eber Jones can accept . Patient and support person need teaching , nurse aware Patient aware        Expected Discharge Plan: Home w Home Health Services     Patient Goals and CMS Choice Patient states their goals for this hospitalization and ongoing recovery are:: to return to home CMS Medicare.gov Compare Post Acute Care list provided to:: Patient Choice offered to / list presented to : Patient  Expected Discharge Plan and Services Expected Discharge Plan: Home w Home Health Services   Discharge Planning Services: CM Consult Post Acute Care Choice: Home Health Living arrangements for the past 2 months: Single Family Home                 DME Arranged: N/A DME Agency: NA       HH Arranged: RN          Prior Living  Arrangements/Services Living arrangements for the past 2 months: Single Family Home Lives with:: Friends Patient language and need for interpreter reviewed:: Yes Do you feel safe going back to the place where you live?: Yes      Need for Family Participation in Patient Care: Yes (Comment) Care giver support system in place?: Yes (comment)   Criminal Activity/Legal Involvement Pertinent to Current Situation/Hospitalization: No - Comment as needed  Activities of Daily Living      Permission Sought/Granted   Permission granted to share information with : No              Emotional Assessment Appearance:: Appears stated age Attitude/Demeanor/Rapport: Engaged Affect (typically observed): Accepting Orientation: : Oriented to Self, Oriented to Place, Oriented to  Time, Oriented to Situation Alcohol / Substance Use: Not Applicable Psych Involvement: No (comment)  Admission diagnosis:  Diverticulitis of colon with perforation [K57.20] Diverticulitis of large intestine with perforation without abscess or bleeding [K57.20] Patient Active Problem List   Diagnosis Date Noted   Diverticulitis of colon with perforation 02/11/2021   Leukocytosis 02/11/2021   Anxiety 02/11/2021   Tobacco use 02/11/2021   Essential hypertension 12/20/2019   PCP:  Clemencia Course, PA-C Pharmacy:   [facility] [phone] [place], [place] - [address] [place] [address] Phone: [phone] Fax: [phone]  [facility] [address] [place] [address] Phone: [phone] Fax: [phone]  [id number] [place], [place] - [address] [place] [address] Phone: [phone] Fax: [phone]     Social Determinants of Health (SDOH) Interventions    Readmission Risk Interventions No flowsheet data found.

## 2021-02-18 NOTE — Progress Notes (Signed)
Initial Nutrition Assessment  DOCUMENTATION CODES:   Not applicable  INTERVENTION:   -D/c Boost Breeze po TID, each supplement provides 250 kcal and 9 grams of protein  -MVI with minerals daily -Ensure Enlive po BID, each supplement provides 350 kcal and 20 grams of protein  -Case discussed with general surgery PA; received permission to advance diet to soft  NUTRITION DIAGNOSIS:   Increased nutrient needs related to post-op healing as evidenced by estimated needs.  GOAL:   Patient will meet greater than or equal to 90% of their needs  MONITOR:   PO intake, Supplement acceptance, Diet advancement, Labs, Weight trends, Skin, I & O's  REASON FOR ASSESSMENT:   NPO/Clear Liquid Diet    ASSESSMENT:   Wendy Cooper is a 50 y.o. female with medical history significant for HTN, colitis, anxiety who presents for evaluation of severe abdominal pain over the past 3 days. Pain has progressively worsening.  She reports that the pain started on Sunday morning in her lower abdomen.  Now her entire abdomen is hurting and is a 10 out of 10 at worst.  Pain is exacerbated by movement, coughing.  She went to med [facility] ER yesterday to be evaluated but after waiting 7 hours she left without being seen.  She reports she has developed nausea but has not had vomiting or diarrhea with the abdominal pain.  She developed fever today.  She did not take any medications at home for her pain.  Pain was so severe yesterday she could not get out of bed.  Reports she has to hold her lower stomach when she walks to try to reduce the pain.  She has not had any injury or trauma to her abdomen.  Have a surgical history of tubal ligation and hernia repair.  Pt admitted with diverticulitis of colon with perforation.   6/10- s/p Procedure(s) with comments: EXPLORATORY LAPAROTOMY (N/A) - PARTIAL COLECTOMY AND COLOSTOMY (N/A)   Reviewed I/O's: +2.3 L x 24 hours and +7.6 L since admission  Spoke with pt at  bedside, who was very frustrated at time of visit. She shares that she has been having GI issues since December 2021 after COVID diagnosis, including diarrhea, constipation, colitis, and diverticulitis. Pt shares that intake has been erratic due to this. Per pt, she has lost weight, but unsure how much. Noted meal completion 4.2% wt loss over the past 4 months, which is not significant for time frame.   Pt frustrated over lack of diet advancement. Pt has been consuming clear liquids and tolerating well. Pt shares that she is "starving" and desires solid food- "I promise you- I'll tolerate it and not throw up". She has had output out of her ostomy bag.  Case discussed with general surgery PA. Received permission to advance diet to soft diet.   Medications reviewed and include remeron and dextrose 5% and 0.45% NaCl with KCl 20 mEq/L infusion @ 100 ml/hr.   Labs reviewed.   NUTRITION - FOCUSED PHYSICAL EXAM:  Flowsheet Row Most Recent Value  Orbital Region No depletion  Upper Arm Region No depletion  Thoracic and Lumbar Region No depletion  Buccal Region No depletion  Temple Region No depletion  Clavicle Bone Region No depletion  Clavicle and Acromion Bone Region No depletion  Scapular Bone Region No depletion  Dorsal Hand No depletion  Patellar Region No depletion  Anterior Thigh Region No depletion  Posterior Calf Region No depletion  Edema (RD Assessment) None  Hair Reviewed  Eyes Reviewed  Mouth Reviewed  Skin Reviewed  Nails Reviewed       Diet Order:   Diet Order             DIET SOFT Room service appropriate? Yes; Fluid consistency: Thin  Diet effective now                   EDUCATION NEEDS:   Education needs have been addressed  Skin:  Skin Assessment: Skin Integrity Issues: Skin Integrity Issues:: Incisions Incisions: closed abdomen  Last BM:  02/17/21 (15 ml output via colostomy)  Height:   Ht Readings from Last 1 Encounters:  02/10/21 5\' 8"  (1.727  m)    Weight:   Wt Readings from Last 1 Encounters:  02/10/21 104.3 kg    Ideal Body Weight:  63.6 kg  BMI:  There is no height or weight on file to calculate BMI.  Estimated Nutritional Needs:   Kcal:  1900-2100  Protein:  110-125 grams  Fluid:  > 1.9 L    04/12/21, RD, LDN, CDCES Registered Dietitian II Certified Diabetes Care and Education Specialist Please refer to [facility] for RD and/or RD on-call/weekend/after hours pager

## 2021-02-18 NOTE — Progress Notes (Signed)
PROGRESS NOTE  Wendy Cooper  TIW:[id number] DOB: December 19, 1970 DOA: 02/11/2021 PCP: Clemencia Course, PA-C   Brief Narrative: Wendy Cooper is a 50 y.o. female with a history of colitis, HTN, and anxiety who presented to the ED at [facility] initially 6/6 for severe abdominal pain. She left after 7 hours without being seen only to return the next day to [facility]ED for the same complaint. She was found to have leukocytosis (WBC 19.5k) and developed a fever to 101.29F after several hours in the waiting room and was taken for CT abd/pelvis which demonstrated sigmoid colon perforation with walled off 1.1 - 1.3 cm air collection. She was started on zosyn, IV fluids, IV analgesics, and surgery was consulted. Due to persistent evidence of peritonitis, she went for colectomy and colostomy 6/10. Has remained on dilaudid PCA since that time with some return of bowel function for the first time on 6/14.   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 Derrell Lolling.  - Has received 6 days of zosyn. WBC is up but pt afebrile and wound looks good and exam improving. Defer to surgery regarding need to continue abx - Postop diet (NPO with sips of clears, Boost) and analgesia (multimodal including dilaudid PCA) per surgery. Pt does have evidence of bowel function returning this morning after surgery rounded, so I've sent them a message.   HTN:  - Continue home lisinopril/HCTZ   Anxiety, depression:  - Xanax changed to ativan to bypass questionable absorption. - Restarted home cymbalta if able to take   Tobacco use:  - Nicotine patch - Cessation counseling to be provided prior to discharge.   Hypokalemia: Resolved. - Continue supplementation in balanced IVF.  Insomnia:  - Restarted  remeron (disintegrating tab)  Obesity: Estimated body mass index is 34.97 kg/m as calculated from the following:   Height as of 02/10/21: 5\' 8"  (1.727 m).   Weight as of 02/10/21: 104.3 kg.  DVT prophylaxis: Lovenox 40mg  q24h (ok per surgery) Code Status: Full Family Communication: None at bedside Disposition Plan:  Status is: Inpatient  Remains inpatient appropriate because:Ongoing active pain requiring inpatient pain management and IV treatments appropriate due to intensity of illness or inability to take PO   Dispo: The patient is from: Home              Anticipated d/c is to: Home              Patient currently is not medically stable to d/c.   Difficult to place patient No  Consultants:  General surgery  Procedures:  Colectomy/colostomy 02/14/2021, Dr.  Antimicrobials: Zosyn 6/7 >>   Subjective: Frustrated at how long it's taken to progress after surgery but her abdomen is much less tender than prior exams. Not using the PCA bolus function very much per pt. Wants to eat. No stool in bag per pt but when I checked there was brown stool there.  Objective: Vitals:   02/18/21 0421 02/18/21 0421 02/18/21 0804 02/18/21 0947  BP:  123/62  (!) 139/99  Pulse:  74  85  Resp: 18 17 17 18   Temp:  97.6 F (36.4 C)  98.1 F (36.7 C)  TempSrc:    Oral  SpO2: 96% 96% 97% 100%    Intake/Output Summary (Last 24 hours) at  02/18/2021 1103 Last data filed at 02/18/2021 0509 Gross per 24 hour  Intake 2105.04 ml  Output 0 ml  Net 2105.04 ml   Gen: 50 y.o. female in no distress Pulm: Nonlabored breathing room air. Clear. CV: Regular rate and rhythm. No murmur, rub, or gallop. No JVD, no dependent edema. GI: Abdomen soft, moderately diffusely tender mostly lower quadrants with wound under c/d/I dressing not directly examined. Elsewhere non-tender, non-distended, with normoactive bowel sounds. Brown soft stool in colostomy. Ext: Warm, no deformities Skin: No new rashes, lesions  or ulcers on visualized skin. Neuro: Alert and oriented. No focal neurological deficits. Psych: Judgement and insight appear fair. Mood euthymic & affect congruent. Behavior is appropriate.    Data Reviewed: I have personally reviewed following labs and imaging studies  CBC: Recent Labs  Lab 02/13/21 0800 02/14/21 0128 02/15/21 0109 02/16/21 0510 02/17/21 1137  WBC 11.4* 9.1 13.2* 15.1* 17.8*  NEUTROABS 8.3* 5.4  --   --   --   HGB 11.0* 11.0* 12.3 11.9* 11.3*  HCT 33.0* 33.0* 36.8 36.1 34.5*  MCV 88.0 88.7 88.0 88.5 87.8  PLT 208 232 287 309 377   Basic Metabolic Panel: Recent Labs  Lab 02/14/21 0128 02/15/21 0109 02/16/21 0510 02/17/21 1137 02/18/21 0606  NA 140 139 137 137 137  K 3.3* 4.1 3.8 3.7 3.6  CL 106 107 104 100 105  CO2 27 22 25 25 24   GLUCOSE 95 126* 104* 106* 117*  BUN 5* <5* <5* 5* <5*  CREATININE 0.84 0.73 0.82 0.83 0.76  CALCIUM 8.8* 8.9 8.8* 9.3 8.8*  MG  --  1.8  --   --   --    GFR: Estimated Creatinine Clearance: 106.4 mL/min (by C-G formula based on SCr of 0.76 mg/dL). Liver Function Tests: Recent Labs  Lab 02/11/21 1315 02/14/21 0128  AST 13* 14*  ALT 11 13  ALKPHOS 62 56  BILITOT 0.9 0.6  PROT 7.1 5.8*  ALBUMIN 3.5 2.6*   Recent Labs  Lab 02/11/21 1315  LIPASE 20   Urine analysis:    Component Value Date/Time   COLORURINE YELLOW 02/11/2021 1413   APPEARANCEUR HAZY (A) 02/11/2021 1413   LABSPEC 1.017 02/11/2021 1413   PHURINE 6.0 02/11/2021 1413   GLUCOSEU NEGATIVE 02/11/2021 1413   HGBUR SMALL (A) 02/11/2021 1413   BILIRUBINUR NEGATIVE 02/11/2021 1413   KETONESUR NEGATIVE 02/11/2021 1413   PROTEINUR NEGATIVE 02/11/2021 1413   NITRITE NEGATIVE 02/11/2021 1413   LEUKOCYTESUR TRACE (A) 02/11/2021 1413   Recent Results (from the past 240 hour(s))  Resp Panel by RT-PCR (Flu A&B, Covid) Nasopharyngeal Swab     Status: None   Collection Time: 02/11/21  9:26 PM   Specimen: Nasopharyngeal Swab; Nasopharyngeal(NP) swabs in vial  transport medium  Result Value Ref Range Status   SARS Coronavirus 2 by RT PCR NEGATIVE NEGATIVE Final    Comment: (NOTE) SARS-CoV-2 target nucleic acids are NOT DETECTED.  The SARS-CoV-2 RNA is generally detectable in upper respiratory specimens during the acute phase of infection. The lowest concentration of SARS-CoV-2 viral copies this assay can detect is 138 copies/mL. A negative result does not preclude SARS-Cov-2 infection and should not be used as the sole basis for treatment or other patient management decisions. A negative result may occur with  improper specimen collection/handling, submission of specimen other than nasopharyngeal swab, presence of viral mutation(s) within the areas targeted by this assay, and inadequate number of viral copies(<138 copies/mL). A negative result must be combined  with clinical observations, patient history, and epidemiological information. The expected result is Negative.  Fact Sheet for Patients:  BloggerCourse.com  Fact Sheet for Healthcare Providers:  SeriousBroker.it  This test is no t yet approved or cleared by the [place] FDA and  has been authorized for detection and/or diagnosis of SARS-CoV-2 by FDA under an Emergency Use Authorization (EUA). This EUA will remain  in effect (meaning this test can be used) for the duration of the COVID-19 declaration under Section 564(b)(1) of the Act, 21 U.S.C.section 360bbb-3(b)(1), unless the authorization is terminated  or revoked sooner.       Influenza A by PCR NEGATIVE NEGATIVE Final   Influenza B by PCR NEGATIVE NEGATIVE Final    Comment: (NOTE) The Xpert Xpress SARS-CoV-2/FLU/RSV plus assay is intended as an aid in the diagnosis of influenza from Nasopharyngeal swab specimens and should not be used as a sole basis for treatment. Nasal washings and aspirates are unacceptable for Xpert Xpress SARS-CoV-2/FLU/RSV testing.  Fact  Sheet for Patients: BloggerCourse.com  Fact Sheet for Healthcare Providers: SeriousBroker.it  This test is not yet approved or cleared by the [place] FDA and has been authorized for detection and/or diagnosis of SARS-CoV-2 by FDA under an Emergency Use Authorization (EUA). This EUA will remain in effect (meaning this test can be used) for the duration of the COVID-19 declaration under Section 564(b)(1) of the Act, 21 U.S.C. section 360bbb-3(b)(1), unless the authorization is terminated or revoked.  Performed at [facility], [address], [place], [place] [address]       Radiology Studies: No results found.   Scheduled Meds:  acetaminophen  1,000 mg Oral Q6H   Chlorhexidine Gluconate Cloth  6 each Topical Daily   DULoxetine  20 mg Oral Daily   enoxaparin (LOVENOX) injection  40 mg Subcutaneous Q24H   feeding supplement  1 Container Oral TID BM   hydrochlorothiazide  12.5 mg Oral Daily   HYDROmorphone   Intravenous Q4H   lisinopril  20 mg Oral Daily   mirtazapine  30 mg Oral QHS   nicotine  7 mg Transdermal Daily   Continuous Infusions:  dextrose 5 % and 0.45 % NaCl with KCl 20 mEq/L 100 mL/hr at 02/18/21 0506   methocarbamol (ROBAXIN) IV 1,000 mg (02/18/21 0507)   piperacillin-tazobactam (ZOSYN)  IV 3.375 g (02/18/21 0507)     LOS: 7 days   Time spent: 25 minutes.  Tyrone Nine, MD [facility] [email or web address] 02/18/2021, 11:03 AM

## 2021-02-18 NOTE — Progress Notes (Signed)
Pt doing well with her soft diet, no N/V. Stoma is putting our soft liquidy brown stool. Stoma is beefy red in color and peristomal skin is intact. Changed 2 piece system with Hollister 2 1/4", no ring, saved pattern for next use, but ordered 2 3/4" for next time.  Pt beginning to participate in learning about pouch change:  Informed pt change every 4-7 days or PRN leakage, skin prep to save skin, how to cut the base piece. She began talking about how to "burp" the bag for gas. Also showed her how to change the dressing using moist to dry gauze on open areas. Created a window of foam dressings on 3 sides of the ostomy 2 piece system to reinforce it and placed an occlusive piece in between the pouching system and the incision to protect it. Pt is much more engaged in learning than before.

## 2021-02-19 LAB — CBC
HCT: 36.1 % (ref 36.0–46.0)
Hemoglobin: 12.2 g/dL (ref 12.0–15.0)
MCH: 29.5 pg (ref 26.0–34.0)
MCHC: 33.8 g/dL (ref 30.0–36.0)
MCV: 87.4 fL (ref 80.0–100.0)
Platelets: UNDETERMINED 10*3/uL (ref 150–400)
RBC: 4.13 MIL/uL (ref 3.87–5.11)
RDW: 13.6 % (ref 11.5–15.5)
WBC: 11.3 10*3/uL — ABNORMAL HIGH (ref 4.0–10.5)
nRBC: 0.2 % (ref 0.0–0.2)

## 2021-02-19 NOTE — Discharge Instructions (Addendum)
Do not exceed 4,000mg  tylenol in a 24 hour period.    [facility]      [facility], [place] [phone]  OPEN ABDOMINAL SURGERY: POST OP INSTRUCTIONS  Always review your discharge instruction sheet given to you by the facility where your surgery was performed.  IF YOU HAVE DISABILITY OR FAMILY LEAVE FORMS, YOU MUST BRING THEM TO THE OFFICE FOR PROCESSING.  PLEASE DO NOT GIVE THEM TO YOUR DOCTOR.  A prescription for pain medication may be given to you upon discharge.  Take your pain medication as prescribed, if needed.  If narcotic pain medicine is not needed, then you may take acetaminophen (Tylenol) or ibuprofen (Advil) as needed. Take your usually prescribed medications unless otherwise directed. If you need a refill on your pain medication, please contact your pharmacy. They will contact our office to request authorization.  Prescriptions will not be filled after 5pm or on week-ends. You should follow a light diet the first few days after arrival home, such as soup and crackers, pudding, etc.unless your doctor has advised otherwise. A high-fiber, low fat diet can be resumed as tolerated.   Be sure to include lots of fluids daily. Most patients will experience some swelling and bruising on the chest and neck area.  Ice packs will help.  Swelling and bruising can take several days to resolve Most patients will experience some swelling and bruising in the area of the incision. Ice pack will help. Swelling and bruising can take several days to resolve..  It is common to experience some constipation if taking pain medication after surgery.  Increasing fluid intake and taking a stool softener will usually help or prevent this problem from occurring.  A mild laxative (Milk of Magnesia or Miralax) should be taken according to package directions if there are no bowel movements after 48 hours.  You may have steri-strips (small skin tapes) in place directly over the incision.  These strips should be left  on the skin for 7-10 days.  If your surgeon used skin glue on the incision, you may shower in 24 hours.  The glue will flake off over the next 2-3 weeks.  Any sutures or staples will be removed at the office during your follow-up visit. You may find that a light gauze bandage over your incision may keep your staples from being rubbed or pulled. You may shower and replace the bandage daily. ACTIVITIES:  You may resume regular (light) daily activities beginning the next day--such as daily self-care, walking, climbing stairs--gradually increasing activities as tolerated.  You may have sexual intercourse when it is comfortable.  Refrain from any heavy lifting or straining until approved by your doctor. You may drive when you no longer are taking prescription pain medication, you can comfortably wear a seatbelt, and you can safely maneuver your car and apply brakes Return to Work: ___________________________________ Bonita Quin should see your doctor in the office for a follow-up appointment approximately two weeks after your surgery.  Make sure that you call for this appointment within a day or two after you arrive home to insure a convenient appointment time. OTHER INSTRUCTIONS:  _____________________________________________________________ _____________________________________________________________  WHEN TO CALL YOUR DOCTOR: Fever over 101.0 Inability to urinate Nausea and/or vomiting Extreme swelling or bruising Continued bleeding from incision. Increased pain, redness, or drainage from the incision. Difficulty swallowing or breathing Muscle cramping or spasms. Numbness or tingling in hands or feet or around lips.  The clinic staff is available to answer your questions during regular business hours.  Please don't hesitate to call and ask to speak to one of the nurses if you have concerns.  For further questions, please visit [email or web address]

## 2021-02-19 NOTE — Progress Notes (Signed)
Progress Note  5 Days Post-Op  Subjective: CC: feeling really well this am and in good spirits. She is having good ostomy output and tolerating soft diet without abdominal pain, nausea, emesis. She is feeling increasingly confident and optimistic about caring for her colostomy. No complaints  Objective: Vital signs in last 24 hours: Temp:  [97.5 F (36.4 C)-98.3 F (36.8 C)] 97.5 F (36.4 C) (06/15 0512) Pulse Rate:  [66-85] 67 (06/15 0512) Resp:  [16-20] 18 (06/15 0512) BP: (126-143)/(72-99) 126/72 (06/15 0512) SpO2:  [94 %-100 %] 95 % (06/15 0512) Last BM Date: 02/19/21  Intake/Output from previous day: 06/14 0701 - 06/15 0700 In: 1512.5 [P.O.:550; I.V.:772.5; IV Piggyback:190] Out: 176 [Stool:176] Intake/Output this shift: No intake/output data recorded.  PE: General: pleasant, WD, female who is laying in bed in NAD HEENT: head is normocephalic, atraumatic.  Mouth is pink and moist Heart: Palpable radial pulses - no cyanosis Lungs: Respiratory effort nonlabored Abd: soft, +BS, mildly distended. Very mild TTP in bilateral lower quadrants. Staples intact with old blood and small amount of bloody serous drainage. No palpable fluid collections along incision. Colostomy with soft liquid stool output MS: all 4 extremities are symmetrical with no cyanosis, clubbing, or edema. No calf TTP Skin: warm and dry with no masses, lesions, or rashes Psych: A&Ox3 with an appropriate affect.     Lab Results:  Recent Labs    02/17/21 1137  WBC 17.8*  HGB 11.3*  HCT 34.5*  PLT 377    BMET Recent Labs    02/17/21 1137 02/18/21 0606  NA 137 137  K 3.7 3.6  CL 100 105  CO2 25 24  GLUCOSE 106* 117*  BUN 5* <5*  CREATININE 0.83 0.76  CALCIUM 9.3 8.8*    PT/INR No results for input(s): LABPROT, INR in the last 72 hours. CMP     Component Value Date/Time   NA 137 02/18/2021 0606   K 3.6 02/18/2021 0606   CL 105 02/18/2021 0606   CO2 24 02/18/2021 0606   GLUCOSE 117  (H) 02/18/2021 0606   BUN <5 (L) 02/18/2021 0606   CREATININE 0.76 02/18/2021 0606   CALCIUM 8.8 (L) 02/18/2021 0606   PROT 5.8 (L) 02/14/2021 0128   ALBUMIN 2.6 (L) 02/14/2021 0128   AST 14 (L) 02/14/2021 0128   ALT 13 02/14/2021 0128   ALKPHOS 56 02/14/2021 0128   BILITOT 0.6 02/14/2021 0128   GFRNONAA >60 02/18/2021 0606   Lipase     Component Value Date/Time   LIPASE 20 02/11/2021 1315       Studies/Results: No results found.  Anti-infectives: Anti-infectives (From admission, onward)    Start     Dose/Rate Route Frequency Ordered Stop   02/13/21 1015  valACYclovir (VALTREX) tablet 500 mg        500 mg Oral Daily 02/13/21 0921 02/15/21 0957   02/12/21 0600  piperacillin-tazobactam (ZOSYN) IVPB 3.375 g        3.375 g 12.5 mL/hr over 240 Minutes Intravenous Every 8 hours 02/11/21 2317 02/19/21 2359   02/11/21 2315  piperacillin-tazobactam (ZOSYN) IVPB 3.375 g  Status:  Discontinued        3.375 g 12.5 mL/hr over 240 Minutes Intravenous Every 8 hours 02/11/21 2313 02/11/21 2317   02/11/21 2130  piperacillin-tazobactam (ZOSYN) IVPB 3.375 g        3.375 g 100 mL/hr over 30 Minutes Intravenous  Once 02/11/21 2129 02/11/21 2356        Assessment/Plan Perforated diverticulitis  POD5 s/p open Hartman's procedure - Dr. Derrell Lolling 6/10 - transitioned to PO pain control and managing well - zosyn to end today - tolerating soft diet - WBC 11.3 from 17.8 2 days ago - ambulate - BID dressing changes - wet to dry on open areas  FEN: soft ID: zosyn 6/7>6/15 VTE: lovenox  Disposition: HH RN has been set up. Tolerating diet and pain controlled. From surgical perspective could discharge today or tomorrow pending antibiotic completion    LOS: 8 days    Eric Form, [facility] 02/19/2021, 9:38 AM Please see Amion for pager number during day hours 7:00am-4:30pm

## 2021-02-19 NOTE — Plan of Care (Signed)
  Problem: Education: Goal: Knowledge of General Education information will improve Description: Including pain rating scale, medication(s)/side effects and non-pharmacologic comfort measures Outcome: Progressing   Problem: Education: Goal: Knowledge of General Education information will improve Description: Including pain rating scale, medication(s)/side effects and non-pharmacologic comfort measures Outcome: Progressing   Problem: Health Behavior/Discharge Planning: Goal: Ability to manage health-related needs will improve Outcome: Progressing   Problem: Clinical Measurements: Goal: Ability to maintain clinical measurements within normal limits will improve Outcome: Progressing Goal: Will remain free from infection Outcome: Progressing Goal: Diagnostic test results will improve Outcome: Progressing Goal: Respiratory complications will improve Outcome: Progressing Goal: Cardiovascular complication will be avoided Outcome: Progressing   Problem: Activity: Goal: Risk for activity intolerance will decrease Outcome: Progressing   Problem: Nutrition: Goal: Adequate nutrition will be maintained Outcome: Progressing   Problem: Coping: Goal: Level of anxiety will decrease Outcome: Progressing   Problem: Elimination: Goal: Will not experience complications related to bowel motility Outcome: Progressing Goal: Will not experience complications related to urinary retention Outcome: Progressing   Problem: Pain Managment: Goal: General experience of comfort will improve Outcome: Progressing   Problem: Safety: Goal: Ability to remain free from injury will improve Outcome: Progressing   Problem: Skin Integrity: Goal: Risk for impaired skin integrity will decrease Outcome: Progressing   Problem: Education: Goal: Understanding of discharge needs will improve Outcome: Progressing Goal: Verbalization of understanding of the causes of altered bowel function will  improve Outcome: Progressing   Problem: Activity: Goal: Ability to tolerate increased activity will improve Outcome: Progressing   Problem: Bowel/Gastric: Goal: Gastrointestinal status for postoperative course will improve Outcome: Progressing   Problem: Health Behavior/Discharge Planning: Goal: Identification of community resources to assist with postoperative recovery needs will improve Outcome: Progressing   Problem: Nutritional: Goal: Will attain and maintain optimal nutritional status will improve Outcome: Progressing   Problem: Clinical Measurements: Goal: Postoperative complications will be avoided or minimized Outcome: Progressing   Problem: Respiratory: Goal: Respiratory status will improve Outcome: Progressing   Problem: Skin Integrity: Goal: Will show signs of wound healing Outcome: Progressing

## 2021-02-19 NOTE — Consult Note (Signed)
WOC Nurse ostomy follow up Stoma type/location: LMQ colostomy, patient states it has strong odor and pouch applied yesterday found to be leaking. Patient is receptive to learning today and much more participative. She will be managing her ostomy at home and is thankful she will have ongoing teaching through [facility].  Stomal assessment/size: 1 1/4" pink patent and producing soft brown stool  Peristomal assessment: intact  no barrier ring in place and pouch had leaked at 3 and 9 o'clock, where there is some creasing.  Treatment options for stomal/peristomal skin: barrier ring.  I am sending her home with a belt which she may opt to use in the coming days.  She has 7 pouch sets in the room for discharge as well.  Output soft brown stool Ostomy pouching: 2pc.  2 3/4" pouch with barrier ring.  Patient removes pouch and cleans skin with soap and water.  She applies barrier ring and understands rationale to create flat pouching surface.  She cuts barrier to fit today.  Assembles pouch and barrier and rolls pouch closed.  She applies pouch to skin and seals barrier with warmth of her hand. She feels like she can complete this task independently.  We review sitting on the toilet to empty and the process to clean the lip of the pouch.  She feels ready for discharge.  Anticipates going home today.  Education provided: see above.  Performed independent pouch change.  Enrolled patient in [place] [facility] [employer] Discharge program: Yes Will not follow at this time.  Please re-consult if needed.  Maple Hudson MSN, RN, FNP-BC CWON Wound, Ostomy, Continence Nurse Pager [phone]

## 2021-02-19 NOTE — Progress Notes (Signed)
PROGRESS NOTE  [name]  BOF:[id number] DOB: 1971-01-01 DOA: 02/11/2021 PCP: Clemencia Course, PA-C   Brief Narrative: Wendy Cooper is a 50 y.o. female with a history of colitis, HTN, and anxiety who presented to the ED at [facility] initially 6/6 for severe abdominal pain. She left after 7 hours without being seen only to return the next day to [facility]ED for the same complaint. She was found to have leukocytosis (WBC 19.5k) and developed a fever to 101.48F after several hours in the waiting room and was taken for CT abd/pelvis which demonstrated sigmoid colon perforation with walled off 1.1 - 1.3 cm air collection. She was started on zosyn, IV fluids, IV analgesics, and surgery was consulted. Due to persistent evidence of peritonitis, she went for colectomy and colostomy 6/10. Has remained on dilaudid PCA since that time with some return of bowel function for the first time on 6/14. Medications changed to po, DC'ed PCA. Antibiotic course completed 6/15.  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 Derrell Lolling.  - Now completing 7 days zosyn  - Postop diet and analgesia per surgery - RN staff, WOC assisting with ostomy teaching. Pt has been resistant to date.   HTN:  - Continue home lisinopril/HCTZ   Anxiety, depression:  - Restarted home xanax with additional doses available.  - Restarted home cymbalta if able to take   Tobacco use:  - Nicotine patch - Cessation counseling to be provided prior to discharge.   Hypokalemia: Resolved. - Continue supplementation in balanced IVF.  Insomnia:  - Restarted remeron (disintegrating tab)  Obesity: Estimated body mass index is 34.97 kg/m as calculated from the following:   Height as of 02/10/21: 5\' 8"  (1.727  m).   Weight as of 02/10/21: 104.3 kg.  DVT prophylaxis: Lovenox 40mg  q24h  Code Status: Full Family Communication: None at bedside Disposition Plan:  Status is: Inpatient  Remains inpatient appropriate because:Ongoing active pain requiring inpatient pain management and IV treatments appropriate due to intensity of illness or inability to take PO   Dispo: The patient is from: Home              Anticipated d/c is to: Home. D/w surgery, perhaps will be stable in next 24-48 hours.              Patient currently is not medically stable to d/c.   Difficult to place patient No  Consultants:  General surgery  Procedures:  Colectomy/colostomy 02/14/2021, Dr.  Antimicrobials: Zosyn 6/7 - 6/15  Subjective: Abdominal pain is stable, moderate, constant, diffuse, still better controlled without PCA. Anxiety is stable. She is worried about caring for the colostomy as it smells very bad and she still doesn't feel comfortable managing it. No fevers, overall feeling better. Having increased ostomy output over past 24 hours. Walking in room.  Objective: Vitals:   02/18/21 1400 02/18/21 2113 02/19/21 0512 02/19/21 1010  BP: (!) 143/79 140/81 126/72 135/82  Pulse: 66 72 67 81  Resp: 20 16 18 18   Temp: 98.3 F (36.8 C) 98 F (36.7 C) (!) 97.5 F (36.4 C) 98.1 F (36.7 C)  TempSrc: Oral Oral Oral Oral  SpO2: 98% 94% 95% 100%    Intake/Output Summary (Last 24 hours) at 02/19/2021 1041 Last data filed at 02/19/2021 [phone]  Gross per 24 hour  Intake 1512.47 ml  Output 176 ml  Net 1336.47 ml   Gen: 50 y.o. female in no distress Pulm: Nonlabored breathing room air. Clear. CV: Regular rate and rhythm. No murmur, rub, or gallop. No JVD, no dependent edema. GI: Abdomen soft, tender in lower quadrants predominantly, no rebound, voluntary guarding is mild. Non-distended, with normoactive bowel sounds.  Ext: Warm, no deformities Skin: No new rashes, lesions or ulcers on visualized skin. Right  anterior surgical wound w/dressing c/d/I. Ostomy periwound appears without erythema or leakage. Neuro: Alert and oriented. No focal neurological deficits. Psych: Judgement and insight appear fair. Mood anxious & affect congruent. Behavior is appropriate.    Data Reviewed: I have personally reviewed following labs and imaging studies  CBC: Recent Labs  Lab 02/13/21 0800 02/14/21 0128 02/15/21 0109 02/16/21 0510 02/17/21 1137  WBC 11.4* 9.1 13.2* 15.1* 17.8*  NEUTROABS 8.3* 5.4  --   --   --   HGB 11.0* 11.0* 12.3 11.9* 11.3*  HCT 33.0* 33.0* 36.8 36.1 34.5*  MCV 88.0 88.7 88.0 88.5 87.8  PLT 208 232 287 309 377   Basic Metabolic Panel: Recent Labs  Lab 02/14/21 0128 02/15/21 0109 02/16/21 0510 02/17/21 1137 02/18/21 0606  NA 140 139 137 137 137  K 3.3* 4.1 3.8 3.7 3.6  CL 106 107 104 100 105  CO2 27 22 25 25 24   GLUCOSE 95 126* 104* 106* 117*  BUN 5* <5* <5* 5* <5*  CREATININE 0.84 0.73 0.82 0.83 0.76  CALCIUM 8.8* 8.9 8.8* 9.3 8.8*  MG  --  1.8  --   --   --    GFR: Estimated Creatinine Clearance: 106.4 mL/min (by C-G formula based on SCr of 0.76 mg/dL). Liver Function Tests: Recent Labs  Lab 02/14/21 0128  AST 14*  ALT 13  ALKPHOS 56  BILITOT 0.6  PROT 5.8*  ALBUMIN 2.6*   No results for input(s): LIPASE, AMYLASE in the last 168 hours.  Urine analysis:    Component Value Date/Time   COLORURINE YELLOW 02/11/2021 1413   APPEARANCEUR HAZY (A) 02/11/2021 1413   LABSPEC 1.017 02/11/2021 1413   PHURINE 6.0 02/11/2021 1413   GLUCOSEU NEGATIVE 02/11/2021 1413   HGBUR SMALL (A) 02/11/2021 1413   BILIRUBINUR NEGATIVE 02/11/2021 1413   KETONESUR NEGATIVE 02/11/2021 1413   PROTEINUR NEGATIVE 02/11/2021 1413   NITRITE NEGATIVE 02/11/2021 1413   LEUKOCYTESUR TRACE (A) 02/11/2021 1413   Recent Results (from the past 240 hour(s))  Resp Panel by RT-PCR (Flu A&B, Covid) Nasopharyngeal Swab     Status: None   Collection Time: 02/11/21  9:26 PM   Specimen:  Nasopharyngeal Swab; Nasopharyngeal(NP) swabs in vial transport medium  Result Value Ref Range Status   SARS Coronavirus 2 by RT PCR NEGATIVE NEGATIVE Final    Comment: (NOTE) SARS-CoV-2 target nucleic acids are NOT DETECTED.  The SARS-CoV-2 RNA is generally detectable in upper respiratory specimens during the acute phase of infection. The lowest concentration of SARS-CoV-2 viral copies this assay can detect is 138 copies/mL. A negative result does not preclude SARS-Cov-2 infection and should not be used as the sole basis for treatment or other patient management decisions. A negative result may occur with  improper specimen collection/handling, submission of specimen other than nasopharyngeal swab, presence of viral mutation(s) within the areas targeted by this assay, and inadequate number of viral copies(<138 copies/mL). A negative result must be combined with clinical observations, patient history, and epidemiological information. The expected result  is Negative.  Fact Sheet for Patients:  BloggerCourse.com  Fact Sheet for Healthcare Providers:  SeriousBroker.it  This test is no t yet approved or cleared by the [place] FDA and  has been authorized for detection and/or diagnosis of SARS-CoV-2 by FDA under an Emergency Use Authorization (EUA). This EUA will remain  in effect (meaning this test can be used) for the duration of the COVID-19 declaration under Section 564(b)(1) of the Act, 21 U.S.C.section 360bbb-3(b)(1), unless the authorization is terminated  or revoked sooner.       Influenza A by PCR NEGATIVE NEGATIVE Final   Influenza B by PCR NEGATIVE NEGATIVE Final    Comment: (NOTE) The Xpert Xpress SARS-CoV-2/FLU/RSV plus assay is intended as an aid in the diagnosis of influenza from Nasopharyngeal swab specimens and should not be used as a sole basis for treatment. Nasal washings and aspirates are unacceptable for  Xpert Xpress SARS-CoV-2/FLU/RSV testing.  Fact Sheet for Patients: BloggerCourse.com  Fact Sheet for Healthcare Providers: SeriousBroker.it  This test is not yet approved or cleared by the [place] FDA and has been authorized for detection and/or diagnosis of SARS-CoV-2 by FDA under an Emergency Use Authorization (EUA). This EUA will remain in effect (meaning this test can be used) for the duration of the COVID-19 declaration under Section 564(b)(1) of the Act, 21 U.S.C. section 360bbb-3(b)(1), unless the authorization is terminated or revoked.  Performed at [facility], [address], [place], [place] [address]       Radiology Studies: No results found.   Scheduled Meds:  acetaminophen  1,000 mg Oral Q6H   DULoxetine  20 mg Oral Daily   enoxaparin (LOVENOX) injection  40 mg Subcutaneous Q24H   feeding supplement  237 mL Oral BID BM   hydrochlorothiazide  12.5 mg Oral Daily   lisinopril  20 mg Oral Daily   mirtazapine  30 mg Oral QHS   multivitamin with minerals  1 tablet Oral Daily   nicotine  7 mg Transdermal Daily   Continuous Infusions:  dextrose 5 % and 0.45 % NaCl with KCl 20 mEq/L 100 mL/hr at 02/19/21 0927   piperacillin-tazobactam (ZOSYN)  IV 3.375 g (02/19/21 0606)     LOS: 8 days   Time spent: 25 minutes.  Tyrone Nine, MD [facility] [email or web address] 02/19/2021, 10:41 AM

## 2021-02-20 ENCOUNTER — Encounter (HOSPITAL_COMMUNITY): Payer: Self-pay | Admitting: *Deleted

## 2021-02-20 MED ORDER — ACETAMINOPHEN 500 MG PO TABS: 1000.0000 mg | ORAL_TABLET | Freq: Four times a day (QID) | ORAL | 0 refills | Status: AC | PRN

## 2021-02-20 MED ORDER — NAPROXEN 500 MG PO TABS: 500.0000 mg | ORAL_TABLET | Freq: Two times a day (BID) | ORAL | Status: AC | PRN

## 2021-02-20 MED ORDER — NICOTINE 7 MG/24HR TD PT24: 7.0000 mg | MEDICATED_PATCH | Freq: Every day | TRANSDERMAL | 0 refills | Status: DC

## 2021-02-20 NOTE — Progress Notes (Signed)
Discharge teaching given to patient. Patient verbalized understanding of the teaching. Ostomy materials given to patient for home. Patient currently waiting on ride to pick her up.

## 2021-02-20 NOTE — Discharge Summary (Signed)
Physician Discharge Summary  Wendy Cooper EXH:[id number] DOB: [birth year] DOA: 02/11/2021  PCP: Clemencia Course, PA-C  Admit date: 02/11/2021 Discharge date: 02/20/2021  Admitted From: Home Disposition: Home   Recommendations for Outpatient Follow-up:  Follow up with PCP in 1-2 weeks Follow up with general surgery as arranged. Continue tobacco cessation counseling/support.   Home Health: RN Equipment/Devices: Ostomy Discharge Condition: Stable CODE STATUS: Full Diet recommendation: Heart healthy  Brief/Interim Summary: Wendy Cooper is a 50 y.o. female with a history of colitis, HTN, and anxiety who presented to the ED at [facility] initially 6/6 for severe abdominal pain. She left after 7 hours without being seen only to return the next day to [facility] for the same complaint. She was found to have leukocytosis (WBC 19.5k) and developed a fever to 101.31F after several hours in the waiting room and was taken for CT abd/pelvis which demonstrated sigmoid colon perforation with walled off 1.1 - 1.3 cm air collection. She was started on zosyn, IV fluids, IV analgesics, and surgery was consulted. Due to persistent evidence of peritonitis, she went for colectomy and colostomy 6/10. Has remained on dilaudid PCA since that time with some return of bowel function for the first time on 6/14. Medications changed to po, DC'ed PCA. Antibiotic course completed 6/15. On 6/16 she was cleared for discharge having tolerated a diet with return of bowel function.  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 Derrell Lolling. - Completed 7 days zosyn  - Continue home oxycodone prn pain. Last filled 90 tablets on 5/26. No new pain medications recommended by surgery.   HTN: - Continue  home lisinopril/HCTZ   Anxiety, depression: - Restarted home xanax and cymbalta   Tobacco use: - Nicotine patch prescribed - Cessation counseling to be provided prior to discharge.   Hypokalemia: Resolved.    Insomnia: - Continue home med   Obesity: Estimated body mass index is 34.97 kg/m   Discharge Instructions Discharge Instructions     Amb Referral to Ostomy Clinic   Complete by: As directed    Reason for referral modifiers: Pre and post-operative counseling for ostomy management   Diet - low sodium heart healthy   Complete by: As directed    Discharge instructions   Complete by: As directed    Continue wound care per general surgery recommendations Continue ostomy care per teaching provided during hospitalization with supplies provided Follow up with surgery as scheduled Continue taking tylenol, naproxen, and oxycodone prn pain. No new medications required at discharge   Discharge wound care:   Complete by: As directed    per surgery      Allergies as of 02/20/2021       Reactions   Latex Rash   Fentanyl Other (See Comments)   Fentanyl causes hallucinations. Able to tolerate Dilaudid.   Sulfa Antibiotics Nausea Only   Toradol [ketorolac Tromethamine] Nausea Only        Medication List     STOP taking these medications    ciprofloxacin 500 MG tablet Commonly known as: CIPRO   fluticasone 50 MCG/ACT nasal spray Commonly known as: FLONASE   HYDROcodone-acetaminophen 5-325 MG tablet Commonly known as: NORCO/VICODIN   lidocaine 5 % Commonly known as: Lidoderm   lisinopril-hydrochlorothiazide 20-12.5 MG tablet Commonly known as: ZESTORETIC   metroNIDAZOLE  500 MG tablet Commonly known as: FLAGYL   predniSONE 10 MG (21) Tbpk tablet Commonly known as: STERAPRED UNI-PAK 21 TAB       TAKE these medications    acetaminophen 500 MG tablet Commonly known as: TYLENOL Take 2 tablets (1,000 mg total) by mouth every 6 (six) hours as needed for mild  pain or moderate pain.   alprazolam 2 MG tablet Commonly known as: XANAX Take 1 mg by mouth daily.   bisoprolol-hydrochlorothiazide 10-6.25 MG tablet Commonly known as: ZIAC Take 1 tablet by mouth at bedtime.   DULoxetine 20 MG capsule Commonly known as: CYMBALTA Take 20 mg by mouth daily.   mirtazapine 30 MG tablet Commonly known as: REMERON Take 30 mg by mouth at bedtime.   naproxen 500 MG tablet Commonly known as: NAPROSYN Take 1 tablet (500 mg total) by mouth 2 (two) times daily as needed for mild pain.   ondansetron 4 MG disintegrating tablet Commonly known as: Zofran ODT 4mg  ODT q4 hours prn nausea/vomit   oxyCODONE-acetaminophen 10-325 MG tablet Commonly known as: PERCOCET Take 1 tablet by mouth 3 (three) times daily as needed for pain.   pantoprazole 40 MG tablet Commonly known as: PROTONIX TAKE 1 TABLET BY MOUTH ONCE DAILY What changed: Another medication with the same name was removed. Continue taking this medication, and follow the directions you see here.   traZODone 50 MG tablet Commonly known as: DESYREL TAKE 1 TO 2 TABLETS AT BEDTIME AS NEEDED FOR INSOMNIA What changed:  how much to take how to take this when to take this reasons to take this               Discharge Care Instructions  (From admission, onward)           Start     Ordered   02/20/21 0000  Discharge wound care:       Comments: per surgery   02/20/21 0803            Follow-up Information     [facility] Follow up.   Contact information: [address] [place] [id number] [phone]         [facility] [place] Go on 02/27/2021.   Specialty: General Surgery Why: Your appointment is 6/23 at 9:30am with a nurse for abdominal staple removal Please arrive 30 minutes prior to your appointment to check in and fill out paperwork. Bring photo ID and insurance information. Contact information: [address] [place] [id number] [phone]        Axel Filleramirez, Armando, MD. Go to.   Specialty: General Surgery Why: Your appointment is 7/14 at 3:40pm Please arrive 15 minutes early to check in Contact information: [address] [place] [address] [phone]                Allergies  Allergen Reactions   Latex Rash   Fentanyl Other (See Comments)    Fentanyl causes hallucinations. Able to tolerate Dilaudid.   Sulfa Antibiotics Nausea Only   Toradol [Ketorolac Tromethamine] Nausea Only    Consultations: General surgery  Procedures/Studies: CT Abdomen Pelvis W Contrast  Result Date: 02/11/2021 CLINICAL DATA:  Lower abdominal pain. EXAM: CT ABDOMEN AND PELVIS WITH CONTRAST TECHNIQUE: Multidetector CT imaging of the abdomen and pelvis was performed using the standard protocol following bolus administration of intravenous contrast. CONTRAST:  100mL OMNIPAQUE IOHEXOL 300 MG/ML  SOLN COMPARISON:  October 23, 2020 FINDINGS:  Lower chest: Mild linear scarring and/or atelectasis is seen within the posterior aspect of the bilateral lung bases. Hepatobiliary: No focal liver abnormality is seen. No gallstones, gallbladder wall thickening, or biliary dilatation. Pancreas: Unremarkable. No pancreatic ductal dilatation or surrounding inflammatory changes. Spleen: Normal in size without focal abnormality. Adrenals/Urinary Tract: Adrenal glands are unremarkable. 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 Bladder is unremarkable. Stomach/Bowel: There is a small hiatal hernia. Appendix appears normal. No evidence of bowel dilatation. Numerous diverticula are seen within a markedly thickened and inflamed mid to distal sigmoid colon. A segment of adjacent markedly thickened ileum is present. A 1.2 cm x 1.1 cm focus of contained free air is seen in between these inflamed bowel loops (axial CT image 71, CT series number 3).  Vascular/Lymphatic: No significant vascular findings are present. No enlarged abdominal or pelvic lymph nodes. Reproductive: Uterus and bilateral adnexa are unremarkable. Other: No abdominal wall hernia or abnormality. No abdominopelvic ascites. Musculoskeletal: Bilateral metallic density pedicle screws are seen at the levels of L5 and S1. IMPRESSION: 1. Marked severity colitis/diverticulitis along the mid to distal sigmoid colon, with marked severity enteritis involving an adjacent loop of ileum. 2. Small focus of adjacent contained free air consistent with associated perforation. 3. Crossed fused ectopia, as described above. 4. Postoperative changes within the lower lumbar spine. Electronically Signed   By: Aram Candela M.D.   On: 02/11/2021 20:35    Colectomy/colostomy 02/14/2021, Dr. Derrell Lolling  Subjective: Feels well, pain controlled, tolerating diet, and ambulating. Has received intensive training on ostomy care and provided supplies. Requests tobacco patch for cessation support at discharge. Wants to go home.  Discharge Exam: Vitals:   02/19/21 2116 02/20/21 0605  BP: 130/79   Pulse: 70 78  Resp: 18 20  Temp: 98.4 F (36.9 C) 98.2 F (36.8 C)  SpO2: 98% 96%   General: Pt is alert, awake, not in acute distress Cardiovascular: RRR, S1/S2 +, no rubs, no gallops Respiratory: CTA bilaterally, no wheezing, no rhonchi Abdominal: Soft, appropriately/less tender to palpation. Midline wound w/drg c/d/I. Ostomy with stool, gas in pouch, peristoma area appears normal and healthy Extremities: No edema, no cyanosis  Labs: BNP (last 3 results) No results for input(s): BNP in the last 8760 hours. Basic Metabolic Panel: Recent Labs  Lab 02/14/21 0128 02/15/21 0109 02/16/21 0510 02/17/21 1137 02/18/21 0606  NA 140 139 137 137 137  K 3.3* 4.1 3.8 3.7 3.6  CL 106 107 104 100 105  CO2 27 22 25 25 24   GLUCOSE 95 126* 104* 106* 117*  BUN 5* <5* <5* 5* <5*  CREATININE 0.84 0.73 0.82 0.83  0.76  CALCIUM 8.8* 8.9 8.8* 9.3 8.8*  MG  --  1.8  --   --   --    Liver Function Tests: Recent Labs  Lab 02/14/21 0128  AST 14*  ALT 13  ALKPHOS 56  BILITOT 0.6  PROT 5.8*  ALBUMIN 2.6*   No results for input(s): LIPASE, AMYLASE in the last 168 hours. No results for input(s): AMMONIA in the last 168 hours. CBC: Recent Labs  Lab 02/14/21 0128 02/15/21 0109 02/16/21 0510 02/17/21 1137 02/19/21 1029  WBC 9.1 13.2* 15.1* 17.8* 11.3*  NEUTROABS 5.4  --   --   --   --   HGB 11.0* 12.3 11.9* 11.3* 12.2  HCT 33.0* 36.8 36.1 34.5* 36.1  MCV 88.7 88.0 88.5 87.8 87.4  PLT 232  287 309 377 PLATELET CLUMPS NOTED ON SMEAR, UNABLE TO ESTIMATE   Cardiac Enzymes: No results for input(s): CKTOTAL, CKMB, CKMBINDEX, TROPONINI in the last 168 hours. BNP: Invalid input(s): POCBNP CBG: No results for input(s): GLUCAP in the last 168 hours. D-Dimer No results for input(s): DDIMER in the last 72 hours. Hgb A1c No results for input(s): HGBA1C in the last 72 hours. Lipid Profile No results for input(s): CHOL, HDL, LDLCALC, TRIG, CHOLHDL, LDLDIRECT in the last 72 hours. Thyroid function studies No results for input(s): TSH, T4TOTAL, T3FREE, THYROIDAB in the last 72 hours.  Invalid input(s): FREET3 Anemia work up No results for input(s): VITAMINB12, FOLATE, FERRITIN, TIBC, IRON, RETICCTPCT in the last 72 hours. Urinalysis    Component Value Date/Time   COLORURINE YELLOW 02/11/2021 1413   APPEARANCEUR HAZY (A) 02/11/2021 1413   LABSPEC 1.017 02/11/2021 1413   PHURINE 6.0 02/11/2021 1413   GLUCOSEU NEGATIVE 02/11/2021 1413   HGBUR SMALL (A) 02/11/2021 1413   BILIRUBINUR NEGATIVE 02/11/2021 1413   KETONESUR NEGATIVE 02/11/2021 1413   PROTEINUR NEGATIVE 02/11/2021 1413   NITRITE NEGATIVE 02/11/2021 1413   LEUKOCYTESUR TRACE (A) 02/11/2021 1413    Microbiology Recent Results (from the past 240 hour(s))  Resp Panel by RT-PCR (Flu A&B, Covid) Nasopharyngeal Swab     Status: None    Collection Time: 02/11/21  9:26 PM   Specimen: Nasopharyngeal Swab; Nasopharyngeal(NP) swabs in vial transport medium  Result Value Ref Range Status   SARS Coronavirus 2 by RT PCR NEGATIVE NEGATIVE Final    Comment: (NOTE) SARS-CoV-2 target nucleic acids are NOT DETECTED.  The SARS-CoV-2 RNA is generally detectable in upper respiratory specimens during the acute phase of infection. The lowest concentration of SARS-CoV-2 viral copies this assay can detect is 138 copies/mL. A negative result does not preclude SARS-Cov-2 infection and should not be used as the sole basis for treatment or other patient management decisions. A negative result may occur with  improper specimen collection/handling, submission of specimen other than nasopharyngeal swab, presence of viral mutation(s) within the areas targeted by this assay, and inadequate number of viral copies(<138 copies/mL). A negative result must be combined with clinical observations, patient history, and epidemiological information. The expected result is Negative.  Fact Sheet for Patients:  BloggerCourse.com  Fact Sheet for Healthcare Providers:  SeriousBroker.it  This test is no t yet approved or cleared by the [place] FDA and  has been authorized for detection and/or diagnosis of SARS-CoV-2 by FDA under an Emergency Use Authorization (EUA). This EUA will remain  in effect (meaning this test can be used) for the duration of the COVID-19 declaration under Section 564(b)(1) of the Act, 21 U.S.C.section 360bbb-3(b)(1), unless the authorization is terminated  or revoked sooner.       Influenza A by PCR NEGATIVE NEGATIVE Final   Influenza B by PCR NEGATIVE NEGATIVE Final    Comment: (NOTE) The Xpert Xpress SARS-CoV-2/FLU/RSV plus assay is intended as an aid in the diagnosis of influenza from Nasopharyngeal swab specimens and should not be used as a sole basis for treatment.  Nasal washings and aspirates are unacceptable for Xpert Xpress SARS-CoV-2/FLU/RSV testing.  Fact Sheet for Patients: BloggerCourse.com  Fact Sheet for Healthcare Providers: SeriousBroker.it  This test is not yet approved or cleared by the [place] FDA and has been authorized for detection and/or diagnosis of SARS-CoV-2 by FDA under an Emergency Use Authorization (EUA). This EUA will remain in effect (meaning this test can be used) for the duration of  the COVID-19 declaration under Section 564(b)(1) of the Act, 21 U.S.C. section 360bbb-3(b)(1), unless the authorization is terminated or revoked.  Performed at [facility], [address], [place], [place] [address]     Time coordinating discharge: Approximately 40 minutes  Tyrone Nine, MD  [facility] 02/20/2021, 8:04 AM

## 2021-02-20 NOTE — Progress Notes (Signed)
Patient discharged to home via wheelchair with all belongings. ?

## 2021-02-20 NOTE — TOC Progression Note (Signed)
Transition of Care [facility]) - Progression Note    Patient Details  Name: Laurissa Cowper MRN: [id number] Date of Birth: Mar 11, 1971  Transition of [facility]) CM/SW Contact  Nadene Rubins Adria Devon, RN Phone Number: 02/20/2021, 11:43 AM  Clinical Narrative:     Eber Jones with [facility] aware discharge today.   Spoke to patient at bedside . Patient wanting assisting with applying for FMLA. NCM explained she would have to contact her work to start the process. Patient unsure which company handles her short term disability benefit through her work. NCM advised patient call her work and ask, or to look through her documents when she was hired for information. Patient stated she did call her work and was fired. She plans to apply for unemployment and hire a Clinical research associate.   Patient stated she called her work and was fired.   Expected Discharge Plan: Home w Home Health Services    Expected Discharge Plan and Services Expected Discharge Plan: Home w Home Health Services   Discharge Planning Services: CM Consult Post Acute Care Choice: Home Health Living arrangements for the past 2 months: Single Family Home Expected Discharge Date: 02/20/21               DME Arranged: N/A DME Agency: NA       HH Arranged: RN           Social Determinants of Health (SDOH) Interventions    Readmission Risk Interventions No flowsheet data found.

## 2021-02-26 LAB — I-STAT BETA HCG BLOOD, ED (NOT ORDERABLE): I-stat hCG, quantitative: <5 m[IU]/mL (ref <5)

## 2021-03-14 ENCOUNTER — Ambulatory Visit (HOSPITAL_COMMUNITY): Payer: BLUE CROSS/BLUE SHIELD

## 2021-03-21 ENCOUNTER — Encounter (HOSPITAL_COMMUNITY): Payer: Self-pay | Admitting: Emergency Medicine

## 2021-03-21 ENCOUNTER — Emergency Department (HOSPITAL_COMMUNITY)
Admission: EM | Admit: 2021-03-21 | Discharge: 2021-03-21 | Disposition: A | Discharge status: Home / Self Care | Payer: BLUE CROSS/BLUE SHIELD | Attending: Emergency Medicine | Admitting: Emergency Medicine

## 2021-03-21 ENCOUNTER — Other Ambulatory Visit: Payer: Self-pay

## 2021-03-21 DIAGNOSIS — Z87891 Personal history of nicotine dependence: Secondary | ICD-10-CM | POA: Diagnosis not present

## 2021-03-21 DIAGNOSIS — I10 Essential (primary) hypertension: Secondary | ICD-10-CM | POA: Insufficient documentation

## 2021-03-21 DIAGNOSIS — Z433 Encounter for attention to colostomy: Secondary | ICD-10-CM | POA: Diagnosis not present

## 2021-03-21 DIAGNOSIS — Z79899 Other long term (current) drug therapy: Secondary | ICD-10-CM | POA: Insufficient documentation

## 2021-03-21 DIAGNOSIS — Z91040 Latex allergy status: Secondary | ICD-10-CM | POA: Insufficient documentation

## 2021-03-21 NOTE — ED Triage Notes (Signed)
Pt denies any medical complaints. Denies pain. Pt reports she has ran out of colostomy supplies and is requesting more.

## 2021-03-21 NOTE — ED Notes (Signed)
Colostomy supplies ordered and given to pt to take home.  Pt verbalizes understanding of d/c instructions. Pt ambulatory at d/c with all belongings. Pt discharged by EDP in triage. See EDP assessment.

## 2021-03-21 NOTE — ED Provider Notes (Signed)
[facility] EMERGENCY DEPARTMENT Provider Note   CSN: [id number] Arrival date & time: 03/21/21  1304     History Chief Complaint  Patient presents with   Colostomy supplies    Wendy Cooper is a 50 y.o. female with medical history as outlined below.  Patient presents emerged department with a chief complaint of requesting colostomy supplies.  Patient states that she ran out of colostomy bags and is having to use a urostomy bag as a makeshift 1.  Patient states that her home health aide supposed to come over today with replacement to help her order 1 for the future however was unable to.  Patient would not be able to see home health provider until Monday.  Patient denies any abdominal pain, nausea, vomiting, fevers, chills, abdominal distention, change in colostomy bag output.  Per chart review patient was admitted to the hospital 6/7- 6/16 for diverticulitis of colon with perforation.  Patient has a colostomy performed on 6/10.  HPI     Past Medical History:  Diagnosis Date   Anxiety    Colitis    Hypertension    Insomnia    PTSD (post-traumatic stress disorder)    Renal disorder     Patient Active Problem List   Diagnosis Date Noted   Diverticulitis of colon with perforation 02/11/2021   Leukocytosis 02/11/2021   Anxiety 02/11/2021   Tobacco use 02/11/2021   Essential hypertension 12/20/2019    Past Surgical History:  Procedure Laterality Date   BACK SURGERY     HIP SURGERY Right    LAPAROTOMY N/A 02/14/2021   Procedure: EXPLORATORY LAPAROTOMY;  Surgeon: Axel Filler, MD;  Location: [facility] OR;  Service: General;  Laterality: N/A;  2.5 HRS - DOW   PARTIAL COLECTOMY N/A 02/14/2021   Procedure: PARTIAL COLECTOMY AND COLOSTOMY;  Surgeon: Axel Filler, MD;  Location: [facility] OR;  Service: General;  Laterality: N/A;   TUBAL LIGATION       OB History   No obstetric history on file.     Family History  Problem Relation Age of Onset   Cancer  Mother    Alzheimer's disease Father     Social History   Tobacco Use   Smoking status: Former    Packs/day: 0.50    Types: Cigarettes    Quit date: 10/13/2020    Years since quitting: 0.4   Smokeless tobacco: Never  Vaping Use   Vaping Use: Never used  Substance Use Topics   Alcohol use: Not Currently   Drug use: Not Currently    Home Medications Prior to Admission medications   Medication Sig Start Date End Date Taking? Authorizing Provider  acetaminophen (TYLENOL) 500 MG tablet Take 2 tablets (1,000 mg total) by mouth every 6 (six) hours as needed for mild pain or moderate pain. 02/20/21   Tyrone Nine, MD  alprazolam Prudy Feeler) 2 MG tablet Take 1 mg by mouth daily. 03/08/20   [provider]  bisoprolol-hydrochlorothiazide (ZIAC) 10-6.25 MG tablet Take 1 tablet by mouth at bedtime. 08/27/20   [provider]  DULoxetine (CYMBALTA) 20 MG capsule Take 20 mg by mouth daily. 02/05/21   [provider]  mirtazapine (REMERON) 30 MG tablet Take 30 mg by mouth at bedtime. 02/05/21   [provider]  naproxen (NAPROSYN) 500 MG tablet Take 1 tablet (500 mg total) by mouth 2 (two) times daily as needed for mild pain. 02/20/21   Tyrone Nine, MD  nicotine (NICODERM CQ - DOSED  IN MG/24 HR) 7 mg/24hr patch Place 1 patch (7 mg total) onto the skin daily. 02/21/21   Tyrone Nine, MD  ondansetron (ZOFRAN ODT) 4 MG disintegrating tablet 4mg  ODT q4 hours prn nausea/vomit 10/16/20   12/14/20, MD  oxyCODONE-acetaminophen (PERCOCET) 10-325 MG tablet Take 1 tablet by mouth 3 (three) times daily as needed for pain. 01/30/21   [provider]  pantoprazole (PROTONIX) 40 MG tablet TAKE 1 TABLET BY MOUTH ONCE DAILY 10/18/20 10/18/21  12/16/21, MD  traZODone (DESYREL) 50 MG tablet TAKE 1 TO 2 TABLETS AT BEDTIME AS NEEDED FOR INSOMNIA 02/06/20   Saguier, 04/07/20, PA-C    Allergies    Latex, Fentanyl, Sulfa antibiotics, and Toradol [ketorolac  tromethamine]  Review of Systems   Review of Systems  Constitutional:  Negative for chills and fever.  Gastrointestinal:  Negative for abdominal distention, abdominal pain, nausea and vomiting.   Physical Exam Updated Vital Signs BP (!) 155/115   Pulse 99   Temp 98.7 F (37.1 C)   Resp 14   Ht 5\' 8"  (1.727 m)   Wt 104.3 kg   SpO2 100%   BMI 34.97 kg/m   Physical Exam Vitals and nursing note reviewed.  Constitutional:      General: She is not in acute distress.    Appearance: She is not ill-appearing, toxic-appearing or diaphoretic.  HENT:     Head: Normocephalic.  Eyes:     General: No scleral icterus.       Right eye: No discharge.        Left eye: No discharge.  Cardiovascular:     Rate and Rhythm: Normal rate.  Pulmonary:     Effort: Pulmonary effort is normal.  Abdominal:     General: There is no distension.     Palpations: There is no mass or pulsatile mass.     Tenderness: There is no abdominal tenderness. There is no guarding or rebound.     Comments: Colostomy bag in place to left lower quadrant, minimal output noted in bag.    Skin:    General: Skin is warm and dry.  Neurological:     General: No focal deficit present.     Mental Status: She is alert and oriented to person, place, and time.     GCS: GCS eye subscore is 4. GCS verbal subscore is 5. GCS motor subscore is 6.  Psychiatric:        Behavior: Behavior is cooperative.    ED Results / Procedures / Treatments   Labs (all labs ordered are listed, but only abnormal results are displayed) Labs Reviewed - No data to display  EKG None  Radiology No results found.  Procedures Procedures   Medications Ordered in ED Medications - No data to display  ED Course  I have reviewed the triage vital signs and the nursing notes.  Pertinent labs & imaging results that were available during my care of the patient were reviewed by me and considered in my medical decision making (see chart for  details).    MDM Rules/Calculators/A&P                          Alert 50 year old female no acute distress, nontoxic-appearing.  Patient presents emergency department with a chief complaint of requesting colostomy supplies.  Patient will not be able to obtain these until Monday.  Patient is using a urostomy bag as a temporary measure at  this time.  Patient denies any pain or complaints.  Colostomy bags were obtained for patient.  Patient given colostomy bags.  Patient to follow-up with primary care provider as needed.  Patient given return precautions.  Final Clinical Impression(s) / ED Diagnoses Final diagnoses:  Colostomy care [facility])    Rx / DC Orders ED Discharge Orders     None        Haskel Schroeder, PA-C 03/21/21 1950    Melene Plan, DO 03/22/21 1512

## 2021-03-21 NOTE — ED Provider Notes (Signed)
Emergency Medicine Provider Triage Evaluation Note  Wendy Cooper , a 50 y.o. female  was evaluated in triage.  Pt complains of needing colostomy supplies.  Patient was admitted to the hospital 6/7 to 6/16 for diverticulitis of colon with perforation.  Patient had colostomy placed 6/10.  Patient presents today because she has run out of colostomy bags.  Patient has had to order these in the outpatient setting.  Patient's home health nurse was supposed to come and help her with this today however was unable to.  Patient will not see home health nurse until Monday.  Patient requesting colostomy bag.  Patient does have temporary urostomy bag over her stoma at this time.  Denies any abdominal pain, change in colostomy output, nausea, vomiting, fevers, chills.  Review of Systems  Positive:  Negative: Abdominal pain, colostomy output change, nausea, vomiting, fevers, chills, abdominal distention  Physical Exam  BP (!) 155/115   Pulse 99   Temp 98.7 F (37.1 C)   Resp 14   SpO2 100%  Gen:   Awake, no distress   Resp:  Normal effort  MSK:   Moves extremities without difficulty  Other   abdomen soft, nondistended, nontender.  Colostomy bag in place with minimal stool output. Medical Decision Making  Medically screening exam initiated at 1:49 PM.  Appropriate orders placed.  Daysie Helf Diloreto was informed that the remainder of the evaluation will be completed by another provider, this initial triage assessment does not replace that evaluation, and the importance of remaining in the ED until their evaluation is complete.  Will attempt to obtain colostomy supplies for patient while she is waiting in triage area.  If able to obtain plan to discharge.    Haskel Schroeder, PA-C 03/21/21 1352    Melene Plan, DO 03/21/21 1623

## 2021-03-21 NOTE — Discharge Instructions (Addendum)
You came to the emergency department today to receive colostomy cellulitis.  Please return to the emergency department does have any acute emergency.  Please follow-up with your primary care provider as needed.

## 2021-03-23 ENCOUNTER — Emergency Department (HOSPITAL_COMMUNITY)
Admission: EM | Admit: 2021-03-23 | Discharge: 2021-03-23 | Disposition: A | Discharge status: Home / Self Care | Payer: BLUE CROSS/BLUE SHIELD | Attending: Emergency Medicine | Admitting: Emergency Medicine

## 2021-03-23 ENCOUNTER — Other Ambulatory Visit: Payer: Self-pay

## 2021-03-23 ENCOUNTER — Encounter (HOSPITAL_COMMUNITY): Payer: Self-pay | Admitting: Emergency Medicine

## 2021-03-23 DIAGNOSIS — I10 Essential (primary) hypertension: Secondary | ICD-10-CM | POA: Insufficient documentation

## 2021-03-23 DIAGNOSIS — Z433 Encounter for attention to colostomy: Secondary | ICD-10-CM | POA: Insufficient documentation

## 2021-03-23 DIAGNOSIS — Z87891 Personal history of nicotine dependence: Secondary | ICD-10-CM | POA: Insufficient documentation

## 2021-03-23 DIAGNOSIS — Z91040 Latex allergy status: Secondary | ICD-10-CM | POA: Insufficient documentation

## 2021-03-23 DIAGNOSIS — Z79899 Other long term (current) drug therapy: Secondary | ICD-10-CM | POA: Insufficient documentation

## 2021-03-23 DIAGNOSIS — Z7189 Other specified counseling: Secondary | ICD-10-CM

## 2021-03-23 NOTE — ED Triage Notes (Signed)
Pt denies any medical complaints. No pain. Has run out of colostomy supplies and is requesting more.

## 2021-03-23 NOTE — ED Notes (Signed)
I gave pt an ostomy kit

## 2021-03-23 NOTE — ED Notes (Signed)
I went to give pt her D/C papers and obtain last set of vitals, but pt was gone.

## 2021-03-23 NOTE — ED Provider Notes (Signed)
[facility] EMERGENCY DEPARTMENT Provider Note   CSN: [id number] Arrival date & time: 03/23/21  1043     History Chief Complaint  Patient presents with   needs colostomy bag    Wendy Cooper is a 50 y.o. female with past medical history significant for ostomy, s/p perforated diverticulitis who presents for evaluation of needing ostomy supplies.  He was seen a few days ago he provided an ostomy bag.  Home health comes out next week to give patient more supplies.  Woke up this morning and her ostomy bag had ruptured.  She denies any fever, chills, nausea, vomiting, change in stool, melena or bright red blood.  No abdominal pain.  Denies additional aggravating or relieving factors.  History obtained from patient and past medical records.  No interpreter used.  HPI     Past Medical History:  Diagnosis Date   Anxiety    Colitis    Hypertension    Insomnia    PTSD (post-traumatic stress disorder)    Renal disorder     Patient Active Problem List   Diagnosis Date Noted   Diverticulitis of colon with perforation 02/11/2021   Leukocytosis 02/11/2021   Anxiety 02/11/2021   Tobacco use 02/11/2021   Essential hypertension 12/20/2019    Past Surgical History:  Procedure Laterality Date   BACK SURGERY     HIP SURGERY Right    LAPAROTOMY N/A 02/14/2021   Procedure: EXPLORATORY LAPAROTOMY;  Surgeon: Axel Filler, MD;  Location: [facility] OR;  Service: General;  Laterality: N/A;  2.5 HRS - DOW   PARTIAL COLECTOMY N/A 02/14/2021   Procedure: PARTIAL COLECTOMY AND COLOSTOMY;  Surgeon: Axel Filler, MD;  Location: [facility] OR;  Service: General;  Laterality: N/A;   TUBAL LIGATION       OB History   No obstetric history on file.     Family History  Problem Relation Age of Onset   Cancer Mother    Alzheimer's disease Father     Social History   Tobacco Use   Smoking status: Former    Packs/day: 0.50    Types: Cigarettes    Quit date: 10/13/2020    Years  since quitting: 0.4   Smokeless tobacco: Never  Vaping Use   Vaping Use: Never used  Substance Use Topics   Alcohol use: Not Currently   Drug use: Not Currently    Home Medications Prior to Admission medications   Medication Sig Start Date End Date Taking? Authorizing Provider  acetaminophen (TYLENOL) 500 MG tablet Take 2 tablets (1,000 mg total) by mouth every 6 (six) hours as needed for mild pain or moderate pain. 02/20/21   Tyrone Nine, MD  alprazolam Prudy Feeler) 2 MG tablet Take 1 mg by mouth daily. 03/08/20   [provider]  bisoprolol-hydrochlorothiazide (ZIAC) 10-6.25 MG tablet Take 1 tablet by mouth at bedtime. 08/27/20   [provider]  DULoxetine (CYMBALTA) 20 MG capsule Take 20 mg by mouth daily. 02/05/21   [provider]  mirtazapine (REMERON) 30 MG tablet Take 30 mg by mouth at bedtime. 02/05/21   [provider]  naproxen (NAPROSYN) 500 MG tablet Take 1 tablet (500 mg total) by mouth 2 (two) times daily as needed for mild pain. 02/20/21   Tyrone Nine, MD  nicotine (NICODERM CQ - DOSED IN MG/24 HR) 7 mg/24hr patch Place 1 patch (7 mg total) onto the skin daily. 02/21/21   Tyrone Nine, MD  ondansetron (ZOFRAN ODT) 4 MG  disintegrating tablet 4mg  ODT q4 hours prn nausea/vomit 10/16/20   12/14/20, MD  oxyCODONE-acetaminophen (PERCOCET) 10-325 MG tablet Take 1 tablet by mouth 3 (three) times daily as needed for pain. 01/30/21   [provider]  pantoprazole (PROTONIX) 40 MG tablet TAKE 1 TABLET BY MOUTH ONCE DAILY 10/18/20 10/18/21  12/16/21, MD  traZODone (DESYREL) 50 MG tablet TAKE 1 TO 2 TABLETS AT BEDTIME AS NEEDED FOR INSOMNIA 02/06/20   Saguier, 04/07/20, PA-C    Allergies    Latex, Fentanyl, Sulfa antibiotics, and Toradol [ketorolac tromethamine]  Review of Systems   Review of Systems  Constitutional: Negative.   HENT: Negative.    Respiratory: Negative.    Cardiovascular: Negative.   Gastrointestinal: Negative.    Genitourinary: Negative.   Musculoskeletal: Negative.   Skin: Negative.   Neurological: Negative.   All other systems reviewed and are negative.  Physical Exam Updated Vital Signs BP (!) 155/103 (BP Location: Right Arm)   Pulse (!) 103   Temp 98.3 F (36.8 C)   Resp 18   SpO2 98%   Physical Exam Vitals and nursing note reviewed.  Constitutional:      General: She is not in acute distress.    Appearance: She is well-developed. She is not ill-appearing.  HENT:     Head: Atraumatic.  Eyes:     Pupils: Pupils are equal, round, and reactive to light.  Cardiovascular:     Rate and Rhythm: Normal rate.     Pulses: Normal pulses.     Heart sounds: Normal heart sounds.  Pulmonary:     Effort: Pulmonary effort is normal. No respiratory distress.     Breath sounds: Normal breath sounds.  Abdominal:     General: Bowel sounds are normal. There is no distension.     Palpations: Abdomen is soft.     Comments: Taped on ostomy bag to left lower abdomen with light brown stool in bag  Musculoskeletal:        General: Normal range of motion.     Cervical back: Normal range of motion.  Skin:    General: Skin is warm and dry.     Capillary Refill: Capillary refill takes less than 2 seconds.  Neurological:     General: No focal deficit present.     Mental Status: She is alert and oriented to person, place, and time.  Psychiatric:        Mood and Affect: Mood normal.    ED Results / Procedures / Treatments   Labs (all labs ordered are listed, but only abnormal results are displayed) Labs Reviewed - No data to display  EKG None  Radiology No results found.  Procedures Procedures   Medications Ordered in ED Medications - No data to display  ED Course  I have reviewed the triage vital signs and the nursing notes.  Pertinent labs & imaging results that were available during my care of the patient were reviewed by me and considered in my medical decision making (see chart for  details).  Here for evaluation needing ostomy supplies.  She has no complaints.  Her heart and lungs are clear.  Her abdomen is soft, nontender.  She has light brown stool in her ostomy bag which is currently taped on.  Has home health coming out next week to provide her with boxes of supplies.  Patient was supplied with some ostomy bags.  Encourage close follow-up outpatient with ostomy clinic and home health.  She  is agreeable  The patient has been appropriately medically screened and/or stabilized in the ED. I have low suspicion for any other emergent medical condition which would require further screening, evaluation or treatment in the ED or require inpatient management.  Patient is hemodynamically stable and in no acute distress.  Patient able to ambulate in department prior to ED.  Evaluation does not show acute pathology that would require ongoing or additional emergent interventions while in the emergency department or further inpatient treatment.  I have discussed the diagnosis with the patient and answered all questions.  Pain is been managed while in the emergency department and patient has no further complaints prior to discharge.  Patient is comfortable with plan discussed in room and is stable for discharge at this time.  I have discussed strict return precautions for returning to the emergency department.  Patient was encouraged to follow-up with PCP/specialist refer to at discharge.     MDM Rules/Calculators/A&P                           Final Clinical Impression(s) / ED Diagnoses Final diagnoses:  Ostomy nurse consultation    Rx / DC Orders ED Discharge Orders     None        Melody Cirrincione A, PA-C 03/23/21 1411    Gerhard Munch, MD 03/25/21 1537

## 2021-03-24 ENCOUNTER — Other Ambulatory Visit: Payer: Self-pay

## 2021-03-24 ENCOUNTER — Ambulatory Visit (HOSPITAL_COMMUNITY): Payer: BLUE CROSS/BLUE SHIELD

## 2021-03-24 ENCOUNTER — Emergency Department (HOSPITAL_COMMUNITY)
Admission: EM | Admit: 2021-03-24 | Discharge: 2021-03-24 | Disposition: A | Discharge status: Home / Self Care | Payer: BLUE CROSS/BLUE SHIELD | Attending: Emergency Medicine | Admitting: Emergency Medicine

## 2021-03-24 DIAGNOSIS — T889XXA Complication of surgical and medical care, unspecified, initial encounter: Secondary | ICD-10-CM | POA: Insufficient documentation

## 2021-03-24 DIAGNOSIS — Z433 Encounter for attention to colostomy: Secondary | ICD-10-CM | POA: Diagnosis not present

## 2021-03-24 DIAGNOSIS — Y733 Surgical instruments, materials and gastroenterology and urology devices (including sutures) associated with adverse incidents: Secondary | ICD-10-CM | POA: Insufficient documentation

## 2021-03-24 DIAGNOSIS — K5720 Diverticulitis of large intestine with perforation and abscess without bleeding: Secondary | ICD-10-CM | POA: Diagnosis not present

## 2021-03-24 DIAGNOSIS — Z531 Procedure and treatment not carried out because of patient's decision for reasons of belief and group pressure: Secondary | ICD-10-CM | POA: Insufficient documentation

## 2021-03-24 NOTE — Consult Note (Signed)
WOC Nurse ostomy consult note Patient has cancelled or no-showed last 2 appointments with outpatient ostomy clinic.  Had appointment today. Ran out of supplies and arrived at ED for assistance.  Stoma type/location: LMQ colostomy Stomal assessment/size: 1 1/4" flush pink stoma, productive of thick brown stool.  Peristomal assessment: intact skin, midline surgical incision has healed with newly epithelialized scarring.  Treatment options for stomal/peristomal skin: Due to flush stoma and rounded abdomen, I am switching her to 1 piece convex pouch.  She has not tried the belt I sent home with her last month.  I am ordering one today.  I demonstrated how to use it and she agrees.  She is unsure if [facility] has arranged for supplies.  I am setting her up with [facility] and will send prescription via fax.  Output soft brown stool Ostomy pouching: 1pc.convex with  barrier ring and ostomy belt. C ut off center to avoid scar to midline abdomen.  Education provided: Patient is anxious, she reports.  She is talking very rapidly and minimally participative in her learning today.   Enrolled patient in [employer] [employer] DC program: Yes Ostomy nurse there has been helping over the phone.  I have ordered her 5 pouches and a new belt.  She will likely discharge once supplies arrive. I encourage her to call and schedule an appointment at the outpatient clinic for ongoing teaching.  Will not follow at this time.  Please re-consult if needed.  Maple Hudson MSN, RN, FNP-BC CWON Wound, Ostomy, Continence Nurse Pager [phone]

## 2021-03-24 NOTE — ED Notes (Signed)
Pt not currently visualized in room. Pt waiting for ostomy supplies which have not been delivered yet.

## 2021-03-24 NOTE — ED Triage Notes (Signed)
Pt needs colostomy bag changes after skin changes post op.

## 2021-03-26 ENCOUNTER — Ambulatory Visit: Admit: 2021-03-26 | Discharge status: Against Medical Advice | Payer: BLUE CROSS/BLUE SHIELD

## 2021-04-11 ENCOUNTER — Other Ambulatory Visit: Payer: Self-pay

## 2021-04-11 ENCOUNTER — Ambulatory Visit (HOSPITAL_COMMUNITY)
Admission: RE | Admit: 2021-04-11 | Discharge: 2021-04-11 | Disposition: A | Discharge status: Home / Self Care | Payer: BLUE CROSS/BLUE SHIELD | Source: Ambulatory Visit | Attending: Nurse Practitioner | Admitting: Nurse Practitioner

## 2021-04-11 DIAGNOSIS — K9400 Colostomy complication, unspecified: Secondary | ICD-10-CM

## 2021-04-11 DIAGNOSIS — K5720 Diverticulitis of large intestine with perforation and abscess without bleeding: Secondary | ICD-10-CM | POA: Diagnosis not present

## 2021-04-11 DIAGNOSIS — Z933 Colostomy status: Secondary | ICD-10-CM | POA: Diagnosis not present

## 2021-04-11 NOTE — Discharge Instructions (Signed)
Call clinic as needed for supplies [phone] Need to know company providing supplies.

## 2021-04-16 NOTE — Progress Notes (Signed)
[facility]   Reason for visit:  LMQ colostomy- doing much better with 1 piece convex HPI:  Perforated diverticulitis with colostomy ROS  Review of Systems  All other systems reviewed and are negative. Vital signs:  There were no vitals taken for this visit. Exam:  Physical Exam Abdominal:     General: The ostomy site is clean.       Comments: LMQ colostomy  Neurological:     Mental Status: She is alert.  Psychiatric:        Mood and Affect: Mood normal.    Stoma type/location:  LMQ colostomy Stomal assessment/size:  1 3/4" pink and moist Peristomal assessment:  intact Treatment options for stomal/peristomal skin: Barrier ring and convex pouch with belt Output: soft brown stool Ostomy pouching: 1pc convex with barrier ring and belt Education provided:  states she is doing great.  She is not leaking, changing bag at predictable intervals.    Impression/dx  Colostomy  Discussion  She does not know who her supplier is. She will call with that information so we can get her set up for ongoing needs.  Plan  Call office with supplier  Call for appointment as needed.     Visit time: 40 minutes.   Maple Hudson FNP-BC

## 2021-04-28 ENCOUNTER — Other Ambulatory Visit (HOSPITAL_COMMUNITY): Payer: Self-pay | Admitting: Nurse Practitioner

## 2021-04-28 DIAGNOSIS — K9400 Colostomy complication, unspecified: Secondary | ICD-10-CM

## 2021-10-03 ENCOUNTER — Emergency Department (HOSPITAL_COMMUNITY)
Admission: EM | Admit: 2021-10-03 | Discharge: 2021-10-03 | Disposition: A | Discharge status: Home / Self Care | Payer: BLUE CROSS/BLUE SHIELD | Attending: Emergency Medicine | Admitting: Emergency Medicine

## 2021-10-03 ENCOUNTER — Emergency Department (HOSPITAL_COMMUNITY): Payer: BLUE CROSS/BLUE SHIELD

## 2021-10-03 DIAGNOSIS — I10 Essential (primary) hypertension: Secondary | ICD-10-CM | POA: Diagnosis not present

## 2021-10-03 DIAGNOSIS — Z87891 Personal history of nicotine dependence: Secondary | ICD-10-CM | POA: Diagnosis not present

## 2021-10-03 DIAGNOSIS — T50901A Poisoning by unspecified drugs, medicaments and biological substances, accidental (unintentional), initial encounter: Secondary | ICD-10-CM | POA: Diagnosis present

## 2021-10-03 DIAGNOSIS — R059 Cough, unspecified: Secondary | ICD-10-CM | POA: Insufficient documentation

## 2021-10-03 LAB — ETHANOL: Alcohol, Ethyl (B): <10 mg/dL (ref <10)

## 2021-10-03 LAB — CBC WITH DIFFERENTIAL/PLATELET
Abs Immature Granulocytes: 0.13 10*3/uL — ABNORMAL HIGH (ref 0.00–0.07)
Basophils Absolute: 0 10*3/uL (ref 0.0–0.1)
Basophils Relative: 0 %
Eosinophils Absolute: 0.1 10*3/uL (ref 0.0–0.5)
Eosinophils Relative: 1 %
HCT: 41.5 % (ref 36.0–46.0)
Hemoglobin: 13.7 g/dL (ref 12.0–15.0)
Immature Granulocytes: 1 %
Lymphocytes Relative: 12 %
Lymphs Abs: 2.3 10*3/uL (ref 0.7–4.0)
MCH: 29.8 pg (ref 26.0–34.0)
MCHC: 33 g/dL (ref 30.0–36.0)
MCV: 90.4 fL (ref 80.0–100.0)
Monocytes Absolute: 1.1 10*3/uL — ABNORMAL HIGH (ref 0.1–1.0)
Monocytes Relative: 6 %
Neutro Abs: 15.9 10*3/uL — ABNORMAL HIGH (ref 1.7–7.7)
Neutrophils Relative %: 80 %
Platelets: 271 10*3/uL (ref 150–400)
RBC: 4.59 MIL/uL (ref 3.87–5.11)
RDW: 12.8 % (ref 11.5–15.5)
WBC: 19.6 10*3/uL — ABNORMAL HIGH (ref 4.0–10.5)
nRBC: 0 % (ref 0.0–0.2)

## 2021-10-03 LAB — COMPREHENSIVE METABOLIC PANEL
ALT: 22 U/L (ref 0–44)
AST: 23 U/L (ref 15–41)
Albumin: 3.9 g/dL (ref 3.5–5.0)
Alkaline Phosphatase: 56 U/L (ref 38–126)
Anion gap: 10 (ref 5–15)
BUN: 24 mg/dL — ABNORMAL HIGH (ref 6–20)
CO2: 23 mmol/L (ref 22–32)
Calcium: 9.1 mg/dL (ref 8.9–10.3)
Chloride: 105 mmol/L (ref 98–111)
Creatinine, Ser: 1.15 mg/dL — ABNORMAL HIGH (ref 0.44–1.00)
GFR, Estimated: 58 mL/min — ABNORMAL LOW (ref >60)
Glucose, Bld: 100 mg/dL — ABNORMAL HIGH (ref 70–99)
Potassium: 3.9 mmol/L (ref 3.5–5.1)
Sodium: 138 mmol/L (ref 135–145)
Total Bilirubin: 0.4 mg/dL (ref 0.3–1.2)
Total Protein: 7.2 g/dL (ref 6.5–8.1)

## 2021-10-03 MED ORDER — CEPHALEXIN 500 MG PO CAPS: 500.0000 mg | ORAL_CAPSULE | Freq: Two times a day (BID) | ORAL | 0 refills | Status: AC

## 2021-10-03 NOTE — Discharge Instructions (Signed)
Please take antibiotics as prescribed.   Please reframe from drug use.   Please follow up with your surgery team.

## 2021-10-03 NOTE — ED Notes (Signed)
Patient discharge instructions reviewed with the patient, the patient verbalized understanding. Patient discharged. °

## 2021-10-03 NOTE — ED Triage Notes (Signed)
Patient BIB [facility] from a vehicle, patient was found unresponsive with agonal respirations. Patient awoke after administration of 2mg  narcan intranasally. Patient alert, oriented, and tearful at this time.   BP 140/90 HR 110 CBG 241 SpO2 94% on room air

## 2021-10-03 NOTE — ED Provider Triage Note (Signed)
Emergency Medicine Provider Triage Evaluation Note  Wendy Cooper , a 51 y.o. female  was evaluated in triage.  Pt presents to the emergency department with an overdose.  Patient was found outside.  Narcan was given she arouse.  Patient denies any drug use although she did states she accidentally took heroin last night.  She did not know it was heroin at that time.  Review of Systems  Positive:  Negative: See above   Physical Exam  BP 128/74 (BP Location: Left Arm)    Pulse 96    Temp 99.4 F (37.4 C) (Oral)    Resp (!) 22    SpO2 92%  Gen:   Awake, no distress   Resp:  Normal effort MSK:   Moves extremities without difficulty  Other:    Medical Decision Making  Medically screening exam initiated at 3:35 PM.  Appropriate orders placed.  Wendy Cooper was informed that the remainder of the evaluation will be completed by another provider, this initial triage assessment does not replace that evaluation, and the importance of remaining in the ED until their evaluation is complete.     Honor Loh [place], [place] 10/03/21 1536

## 2021-10-03 NOTE — ED Provider Notes (Signed)
[facility] Emergency Department Provider Note MRN:  [id number]  Arrival date & time: 10/03/21     Chief Complaint   Drug Overdose   History of Present Illness   Wendy Cooper is a 51 y.o. year-old female with a history of GAD, colitis sp ostomy placement, HTN, PTSD presenting to the ED with chief complaint of accidental overdose.  The patient reports that started COVID-19 she has been experiencing financial difficulties and has had trouble maintaining payments on her home and car.  States that she was having wrist pain after recent carpal tunnel surgery so she asked a friend to give her some pain medications.  The friend gave her an unknown pain medication which she took and then became unresponsive.  The patient was found in her vehicle unresponsive with agonal respirations by EMS team who administered 2 mg of Narcan intranasally.  The patient then became alert and oriented though she was tearful.  The patient states that she has follow-up scheduled to discuss reversal of her ostomy and has follow-up scheduled for evaluation of her recent carpal tunnel surgery.  Patient does admit that she has had cough with productive sputum recently but denies fever, chills, chest pain, or abdominal pain.  The patient states that this was an accidental overdose and states that she had no plans to harm herself or anyone else.  Patient not been experiencing any auditory or visual hallucinations.   Review of Systems  A thorough review of systems was obtained and all systems are negative except as noted in the HPI and PMH.   Patient's Health History    Past Medical History:  Diagnosis Date   Anxiety    Colitis    Hypertension    Insomnia    PTSD (post-traumatic stress disorder)    Renal disorder     Past Surgical History:  Procedure Laterality Date   BACK SURGERY     HIP SURGERY Right    LAPAROTOMY N/A 02/14/2021   Procedure: EXPLORATORY LAPAROTOMY;  Surgeon:  Ralene Ok, MD;  Location: [facility];  Service: General;  Laterality: N/A;  2.5 HRS - DOW   PARTIAL COLECTOMY N/A 02/14/2021   Procedure: PARTIAL COLECTOMY AND COLOSTOMY;  Surgeon: Ralene Ok, MD;  Location: [facility] OR;  Service: General;  Laterality: N/A;   TUBAL LIGATION      Family History  Problem Relation Age of Onset   Cancer Mother    Alzheimer's disease Father     Social History   Socioeconomic History   Marital status: Divorced    Spouse name: Not on file   Number of children: Not on file   Years of education: Not on file   Highest education level: Not on file  Occupational History   Not on file  Tobacco Use   Smoking status: Former    Packs/day: 0.50    Types: Cigarettes    Quit date: 10/13/2020    Years since quitting: 0.9   Smokeless tobacco: Never  Vaping Use   Vaping Use: Never used  Substance and Sexual Activity   Alcohol use: Not Currently   Drug use: Not Currently   Sexual activity: Not on file  Other Topics Concern   Not on file  Social History Narrative   Not on file   Social Determinants of Health   Financial Resource Strain: Not on file  Food Insecurity: Not on file  Transportation Needs: Not on file  Physical Activity: Not on file  Stress: Not on  file  Social Connections: Not on file  Intimate Partner Violence: Not on file     Physical Exam   Physical Exam Constitutional:      Appearance: She is well-developed. She is not ill-appearing.  HENT:     Head: Normocephalic and atraumatic.     Right Ear: External ear normal.     Left Ear: External ear normal.     Nose: Nose normal.  Cardiovascular:     Rate and Rhythm: Normal rate and regular rhythm.     Pulses: Normal pulses.     Heart sounds: Normal heart sounds.  Pulmonary:     Effort: Pulmonary effort is normal. No respiratory distress.     Breath sounds: Normal breath sounds. No wheezing or rales.  Chest:     Chest wall: No tenderness.  Abdominal:     General: Abdomen is flat.  There is no distension.     Palpations: Abdomen is soft.     Tenderness: There is no abdominal tenderness.     Comments: Ostomy site clean dry and intact  Musculoskeletal:     Cervical back: Neck supple.  Skin:    General: Skin is warm and dry.     Findings: Erythema (Area of erythema surrounding recent left carpal tunnel surgery.  No fluctuance noted.) present.  Neurological:     General: No focal deficit present.     Mental Status: She is alert and oriented to person, place, and time.      Diagnostic and Interventional Summary    Labs Reviewed  CBC WITH DIFFERENTIAL/PLATELET - Abnormal; Notable for the following components:      Result Value   WBC 19.6 (*)    Neutro Abs 15.9 (*)    Monocytes Absolute 1.1 (*)    Abs Immature Granulocytes 0.13 (*)    All other components within normal limits  COMPREHENSIVE METABOLIC PANEL - Abnormal; Notable for the following components:   Glucose, Bld 100 (*)    BUN 24 (*)    Creatinine, Ser 1.15 (*)    GFR, Estimated 58 (*)    All other components within normal limits  ETHANOL    DG Chest 1 View  Final Result      Medications - No data to display   Procedures  /  Critical Care Procedures  ED Course and Medical Decision Making  Initial Impression and Ddx 51 year old female presents after an accidental overdose of unknown substance.  Patient reports that she took 1 unknown pill from a friend.  Differential diagnosis includes but sounds to the following: Opioid intoxication/overdose, electrolyte abnormality, hypoxia, seizure disorder.  Most likely diagnosis at this time is opioid overdose given that the patient had abrupt return of consciousness after receiving intranasal Narcan.  Past medical/surgical history that increases complexity of ED encounter: Generalized anxiety.  The patient reiterated to me multiple times that this was not a suicide attempt and she had no plan to harm her self.  Interpretation of Diagnostics I personally  reviewed the EKG and Chest Xray and my interpretation is as follows: EKG was reviewed and revealed patient was in normal sinus rhythm without signs of acute ischemia or dysrhythmia.  Chest x-ray was negative for acute cardiopulmonary process.    Patient was noted to have leukocytosis on CBC.  Expect this could be related to either the stressful event that occurred this afternoon in which she likely overdosed from opioids or issues related to recent carpal tunnel surgery.  Patient Reassessment and Ultimate Disposition/Management  Given that the leukocytosis could be related to her refill carpal tunnel surgery with overlying erythema will prescribe a course of Keflex and encouraged patient to follow-up with her general surgeon.  She is in agreement this plan.  Patient management required discussion with the following services or consulting groups:  None  Complexity of Problems Addressed Acute illness or injury that poses threat of life of bodily function  Additional Data Reviewed and Analyzed Further history obtained from: Recent PCP notes and Recent Consult notes  Factors Impacting ED Encounter Risk Consideration of hospitalization patient states she has limited access to healthcare given financial difficulties.    Final Clinical Impressions(s) / ED Diagnoses     ICD-10-CM   1. Accidental overdose, initial encounter  T50.901A     2. Overdose  T50.901A DG Chest 1 View    DG Chest 1 View      ED Discharge Orders          Ordered    cephALEXin (KEFLEX) 500 MG capsule  2 times daily        10/03/21 1807             Discharge Instructions Discussed with and Provided to Patient:     Discharge Instructions      Please take antibiotics as prescribed.   Please reframe from drug use.   Please follow up with your surgery team.         Zachery Dakins, MD 10/03/21 2153    Lajean Saver, MD 10/04/21 [phone]

## 2021-10-14 ENCOUNTER — Other Ambulatory Visit: Payer: Self-pay | Admitting: General Surgery

## 2021-10-14 ENCOUNTER — Other Ambulatory Visit (HOSPITAL_COMMUNITY): Payer: Self-pay | Admitting: General Surgery

## 2021-10-14 DIAGNOSIS — Z8719 Personal history of other diseases of the digestive system: Secondary | ICD-10-CM

## 2021-10-14 DIAGNOSIS — Z933 Colostomy status: Secondary | ICD-10-CM

## 2022-02-07 ENCOUNTER — Ambulatory Visit (HOSPITAL_COMMUNITY)
Admission: EM | Admit: 2022-02-07 | Discharge: 2022-02-08 | Disposition: A | Discharge status: Home / Self Care | Payer: No Payment, Other | Attending: Physician Assistant | Admitting: Physician Assistant

## 2022-02-07 DIAGNOSIS — F12980 Cannabis use, unspecified with anxiety disorder: Secondary | ICD-10-CM | POA: Insufficient documentation

## 2022-02-07 DIAGNOSIS — Z9151 Personal history of suicidal behavior: Secondary | ICD-10-CM | POA: Diagnosis not present

## 2022-02-07 DIAGNOSIS — F1590 Other stimulant use, unspecified, uncomplicated: Secondary | ICD-10-CM | POA: Insufficient documentation

## 2022-02-07 DIAGNOSIS — R45851 Suicidal ideations: Secondary | ICD-10-CM | POA: Diagnosis not present

## 2022-02-07 DIAGNOSIS — F331 Major depressive disorder, recurrent, moderate: Secondary | ICD-10-CM | POA: Diagnosis not present

## 2022-02-07 DIAGNOSIS — Z20822 Contact with and (suspected) exposure to covid-19: Secondary | ICD-10-CM | POA: Insufficient documentation

## 2022-02-07 DIAGNOSIS — Z79899 Other long term (current) drug therapy: Secondary | ICD-10-CM | POA: Insufficient documentation

## 2022-02-07 DIAGNOSIS — Z933 Colostomy status: Secondary | ICD-10-CM | POA: Insufficient documentation

## 2022-02-07 LAB — POCT URINE DRUG SCREEN - MANUAL ENTRY (I-SCREEN)
POC Amphetamine UR: POSITIVE — AB
POC Buprenorphine (BUP): NOT DETECTED
POC Cocaine UR: NOT DETECTED
POC Marijuana UR: POSITIVE — AB
POC Methadone UR: NOT DETECTED
POC Methamphetamine UR: POSITIVE — AB
POC Morphine: NOT DETECTED
POC Oxazepam (BZO): POSITIVE — AB
POC Oxycodone UR: NOT DETECTED
POC Secobarbital (BAR): NOT DETECTED

## 2022-02-07 LAB — CBC WITH DIFFERENTIAL/PLATELET
Abs Immature Granulocytes: 0.03 10*3/uL (ref 0.00–0.07)
Basophils Absolute: 0.1 10*3/uL (ref 0.0–0.1)
Basophils Relative: 0 %
Eosinophils Absolute: 0.2 10*3/uL (ref 0.0–0.5)
Eosinophils Relative: 2 %
HCT: 40.9 % (ref 36.0–46.0)
Hemoglobin: 14.2 g/dL (ref 12.0–15.0)
Immature Granulocytes: 0 %
Lymphocytes Relative: 39 %
Lymphs Abs: 4.5 10*3/uL — ABNORMAL HIGH (ref 0.7–4.0)
MCH: 30.3 pg (ref 26.0–34.0)
MCHC: 34.7 g/dL (ref 30.0–36.0)
MCV: 87.2 fL (ref 80.0–100.0)
Monocytes Absolute: 0.6 10*3/uL (ref 0.1–1.0)
Monocytes Relative: 5 %
Neutro Abs: 6.1 10*3/uL (ref 1.7–7.7)
Neutrophils Relative %: 54 %
Platelets: 268 10*3/uL (ref 150–400)
RBC: 4.69 MIL/uL (ref 3.87–5.11)
RDW: 12.9 % (ref 11.5–15.5)
WBC: 11.5 10*3/uL — ABNORMAL HIGH (ref 4.0–10.5)
nRBC: 0 % (ref 0.0–0.2)

## 2022-02-07 LAB — POC SARS CORONAVIRUS 2 AG -  ED: SARS Coronavirus 2 Ag: NEGATIVE

## 2022-02-07 LAB — HEMOGLOBIN A1C
Hgb A1c MFr Bld: 5.5 % (ref 4.8–5.6)
Mean Plasma Glucose: 111.15 mg/dL

## 2022-02-07 LAB — POC SARS CORONAVIRUS 2 AG: SARSCOV2ONAVIRUS 2 AG: NEGATIVE

## 2022-02-07 MED ORDER — MAGNESIUM HYDROXIDE 400 MG/5ML PO SUSP: 30.0000 mL | Freq: Every day | ORAL | Status: DC | PRN

## 2022-02-07 MED ORDER — ACETAMINOPHEN 325 MG PO TABS: 650.0000 mg | ORAL_TABLET | Freq: Four times a day (QID) | ORAL | Status: DC | PRN

## 2022-02-07 MED ORDER — HYDROXYZINE HCL 25 MG PO TABS
25.0000 mg | ORAL_TABLET | Freq: Three times a day (TID) | ORAL | Status: DC | PRN
  Administered 2022-02-07: 25 mg via ORAL
  Filled 2022-02-07: qty 1

## 2022-02-07 MED ORDER — ALUM & MAG HYDROXIDE-SIMETH 200-200-20 MG/5ML PO SUSP: 30.0000 mL | ORAL | Status: DC | PRN

## 2022-02-07 MED ORDER — TRAZODONE HCL 50 MG PO TABS
50.0000 mg | ORAL_TABLET | Freq: Every evening | ORAL | Status: DC | PRN
  Administered 2022-02-07: 50 mg via ORAL
  Filled 2022-02-07: qty 1
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
## 2022-02-07 NOTE — ED Provider Notes (Incomplete)
[facility] Continuous Assessment Admission H&P  Date: 02/07/22 Patient Name: Wendy Cooper Rockett MRN: [id number] Chief Complaint:  Chief Complaint  Patient presents with  . Depression      Diagnoses:  Final diagnoses:  Moderate episode of recurrent major depressive disorder (HCC)  Passive suicidal ideations    HPI: ***  PHQ 2-9:   Flowsheet Row ED [facility] to Hosp-Admission (Discharged) [facility]  SURGICAL  C-SSRS RISK CATEGORY No Risk No Risk Error: Question 6 not populated        Total Time spent with patient: 20 minutes  Musculoskeletal  Strength & Muscle Tone: within normal limits Gait & Station: normal Patient leans: N/A  Psychiatric Specialty Exam  Presentation General Appearance: Appropriate for Environment; Casual  Eye Contact:Good  Speech:Clear and Coherent; Normal Rate  Speech Volume:Normal  Handedness:Right   Mood and Affect  Mood:Anxious; Depressed  Affect:Depressed; Tearful; Congruent   Thought Process  Thought Processes:Disorganized; Goal Directed  Descriptions of Associations:Tangential  Orientation:Full (Time, Place and Person)  Thought Content:Scattered; Tangential; WDL  Diagnosis of Schizophrenia or Schizoaffective disorder in past: No   Hallucinations:Hallucinations: None  Ideas of Reference:None  Suicidal Thoughts:Suicidal Thoughts: Yes, Passive SI Passive Intent and/or Plan: Without Intent; Without Plan (Patient states that if she died tomorrow, she would not care.)  Homicidal Thoughts:Homicidal Thoughts: No   Sensorium  Memory:Immediate Good; Recent Good; Remote Fair  Judgment:Fair  Insight:Fair   Executive Functions  Concentration:Fair  Attention Span:Fair  Recall:Fair  Fund of Knowledge:Good  Language:Good   Psychomotor  Activity  Psychomotor Activity:Psychomotor Activity: Restlessness   Assets  Assets:Communication Skills; Desire for Improvement; Housing; Social Support   Sleep  Sleep:Sleep: Fair   Nutritional Assessment ([facility]) Has the patient had a weight loss or gain of 10 pounds or more in the last 3 months?: No Has the patient had a decrease in food intake/or appetite?: Yes Does the patient have dental problems?: No Does the patient have eating habits or behaviors that may be indicators of an eating disorder including binging or inducing vomiting?: No Has the patient recently lost weight without trying?: 0 Has the patient been eating poorly because of a decreased appetite?: 1 Malnutrition Screening Tool Score: 1    Physical Exam Psychiatric:        Attention and Perception: Attention and perception normal. She does not perceive auditory or visual hallucinations.        Mood and Affect: Mood is anxious and depressed. Affect is tearful.        Speech: Speech normal.        Behavior: Behavior normal. Behavior is cooperative.        Thought Content: Thought content is not delusional. Thought content includes suicidal ideation. Thought content does not include homicidal ideation. Thought content does not include suicidal plan.        Cognition and Memory: Cognition and memory normal.        Judgment: Judgment normal.   Review of Systems  Psychiatric/Behavioral:  Positive for depression, substance abuse (Patient admits to using ICE and uses marijuana on occasion) and suicidal ideas (Patient endorses passive suicidal ideations). Negative for hallucinations and memory loss. The patient is nervous/anxious. The patient does not have insomnia.    Blood pressure (!) 161/86, pulse 72, temperature 98.9 F (37.2 C), temperature source Oral, resp. rate  20, SpO2 98 %. There is no height or weight on file to calculate BMI.  Past Psychiatric History:  Severe clinical  depression Anxiety PTSD  Is the patient at risk to self?  Yes Has the patient been a risk to self in the past 6 months? No .    Has the patient been a risk to self within the distant past?  Unknown   Is the patient a risk to others? No   Has the patient been a risk to others in the past 6 months? No   Has the patient been a risk to others within the distant past? No   Past Medical History:  Past Medical History:  Diagnosis Date  . Anxiety   . Colitis   . Hypertension   . Insomnia   . PTSD (post-traumatic stress disorder)   . Renal disorder     Past Surgical History:  Procedure Laterality Date  . BACK SURGERY    . HIP SURGERY Right   . LAPAROTOMY N/A 02/14/2021   Procedure: EXPLORATORY LAPAROTOMY;  Surgeon: Axel Filler, MD;  Location[facility] OR;  Service: General;  Laterality: N/A;  2.5 HRS - DOW  . PARTIAL COLECTOMY N/A 02/14/2021   Procedure: PARTIAL COLECTOMY AND COLOSTOMY;  Surgeon: Axel Filler, MD;  Location: [facility] OR;  Service: General;  Laterality: N/A;  . TUBAL LIGATION      Family History:  Family History  Problem Relation Age of Onset  . Cancer Mother   . Alzheimer's disease Father     Social History:  Social History   Socioeconomic History  . Marital status: Divorced    Spouse name: Not on file  . Number of children: Not on file  . Years of education: Not on file  . Highest education level: Not on file  Occupational History  . Not on file  Tobacco Use  . Smoking status: Former    Packs/day: 0.50    Types: Cigarettes    Quit date: 10/13/2020    Years since quitting: 1.3  . Smokeless tobacco: Never  Vaping Use  . Vaping Use: Never used  Substance and Sexual Activity  . Alcohol use: Not Currently  . Drug use: Not Currently  . Sexual activity: Not on file  Other Topics Concern  . Not on file  Social History Narrative  . Not on file   Social Determinants of Health   Financial Resource Strain: Not on file  Food Insecurity: Not on file   Transportation Needs: Not on file  Physical Activity: Not on file  Stress: Not on file  Social Connections: Not on file  Intimate Partner Violence: Not on file    SDOH:  SDOH Screenings   Alcohol Screen: Not on file  Depression (WUJ8-1): Not on file  Financial Resource Strain: Not on file  Food Insecurity: Not on file  Housing: Not on file  Physical Activity: Not on file  Social Connections: Not on file  Stress: Not on file  Tobacco Use: Medium Risk  . Smoking Tobacco Use: Former  . Smokeless Tobacco Use: Never  . Passive Exposure: Not on file  Transportation Needs: Not on file    Last Labs:  Admission on 02/07/2022  Component Date Value Ref Range Status  . SARS Coronavirus 2 Ag 02/07/2022 Negative  Negative Preliminary  . POC Amphetamine UR 02/07/2022 Positive (A)  NONE DETECTED (Cut Off Level 1000 ng/mL) Final  . POC Secobarbital (BAR) 02/07/2022 None Detected  NONE DETECTED (Cut Off Level 300 ng/mL)  Final  . POC Buprenorphine (BUP) 02/07/2022 None Detected  NONE DETECTED (Cut Off Level 10 ng/mL) Final  . POC Oxazepam (BZO) 02/07/2022 Positive (A)  NONE DETECTED (Cut Off Level 300 ng/mL) Final  . POC Cocaine UR 02/07/2022 None Detected  NONE DETECTED (Cut Off Level 300 ng/mL) Final  . POC Methamphetamine UR 02/07/2022 Positive (A)  NONE DETECTED (Cut Off Level 1000 ng/mL) Final  . POC Morphine 02/07/2022 None Detected  NONE DETECTED (Cut Off Level 300 ng/mL) Final  . POC Methadone UR 02/07/2022 None Detected  NONE DETECTED (Cut Off Level 300 ng/mL) Final  . POC Oxycodone UR 02/07/2022 None Detected  NONE DETECTED (Cut Off Level 100 ng/mL) Final  . POC Marijuana UR 02/07/2022 Positive (A)  NONE DETECTED (Cut Off Level 50 ng/mL) Final  . SARSCOV2ONAVIRUS 2 AG 02/07/2022 NEGATIVE  NEGATIVE Final   Comment: (NOTE) SARS-CoV-2 antigen NOT DETECTED.   Negative results are presumptive.  Negative results do not preclude SARS-CoV-2 infection and should not be used as the sole  basis for treatment or other patient management decisions, including infection  control decisions, particularly in the presence of clinical signs and  symptoms consistent with COVID-19, or in those who have been in contact with the virus.  Negative results must be combined with clinical observations, patient history, and epidemiological information. The expected result is Negative.  Fact Sheet for Patients: https://www.jennings-kim.com/  Fact Sheet for Healthcare Providers: https://alexander-rogers.biz/  This test is not yet approved or cleared by the [place] FDA and  has been authorized for detection and/or diagnosis of SARS-CoV-2 by FDA under an Emergency Use Authorization (EUA).  This EUA will remain in effect (meaning this test can be used) for the duration of  the COV                          ID-19 declaration under Section 564(b)(1) of the Act, 21 U.S.C. section 360bbb-3(b)(1), unless the authorization is terminated or revoked sooner.    Admission on 10/03/2021, Discharged on 10/03/2021  Component Date Value Ref Range Status  . WBC 10/03/2021 19.6 (H)  4.0 - 10.5 K/uL Final  . RBC 10/03/2021 4.59  3.87 - 5.11 MIL/uL Final  . Hemoglobin 10/03/2021 13.7  12.0 - 15.0 g/dL Final  . HCT 62/95/2841 41.5  36.0 - 46.0 % Final  . MCV 10/03/2021 90.4  80.0 - 100.0 fL Final  . MCH 10/03/2021 29.8  26.0 - 34.0 pg Final  . MCHC 10/03/2021 33.0  30.0 - 36.0 g/dL Final  . RDW 32/44/0102 12.8  11.5 - 15.5 % Final  . Platelets 10/03/2021 271  150 - 400 K/uL Final  . nRBC 10/03/2021 0.0  0.0 - 0.2 % Final  . Neutrophils Relative % 10/03/2021 80  % Final  . Neutro Abs 10/03/2021 15.9 (H)  1.7 - 7.7 K/uL Final  . Lymphocytes Relative 10/03/2021 12  % Final  . Lymphs Abs 10/03/2021 2.3  0.7 - 4.0 K/uL Final  . Monocytes Relative 10/03/2021 6  % Final  . Monocytes Absolute 10/03/2021 1.1 (H)  0.1 - 1.0 K/uL Final  . Eosinophils Relative 10/03/2021 1  % Final  .  Eosinophils Absolute 10/03/2021 0.1  0.0 - 0.5 K/uL Final  . Basophils Relative 10/03/2021 0  % Final  . Basophils Absolute 10/03/2021 0.0  0.0 - 0.1 K/uL Final  . Immature Granulocytes 10/03/2021 1  % Final  . Abs Immature Granulocytes 10/03/2021 0.13 (H)  0.00 -  0.07 K/uL Final   Performed at [facility], [address], [place], [place] [address]  . Sodium 10/03/2021 138  135 - 145 mmol/L Final  . Potassium 10/03/2021 3.9  3.5 - 5.1 mmol/L Final  . Chloride 10/03/2021 105  98 - 111 mmol/L Final  . CO2 10/03/2021 23  22 - 32 mmol/L Final  . Glucose, Bld 10/03/2021 100 (H)  70 - 99 mg/dL Final   Glucose reference range applies only to samples taken after fasting for at least 8 hours.  . BUN 10/03/2021 24 (H)  6 - 20 mg/dL Final  . Creatinine, Ser 10/03/2021 1.15 (H)  0.44 - 1.00 mg/dL Final  . Calcium 64/33/2951 9.1  8.9 - 10.3 mg/dL Final  . Total Protein 10/03/2021 7.2  6.5 - 8.1 g/dL Final  . Albumin 88/41/6606 3.9  3.5 - 5.0 g/dL Final  . AST 30/16/0109 23  15 - 41 U/L Final  . ALT 10/03/2021 22  0 - 44 U/L Final  . Alkaline Phosphatase 10/03/2021 56  38 - 126 U/L Final  . Total Bilirubin 10/03/2021 0.4  0.3 - 1.2 mg/dL Final  . GFR, Estimated 10/03/2021 58 (L)  >60 mL/min Final   Comment: (NOTE) Calculated using the CKD-EPI Creatinine Equation (2021)   . Anion gap 10/03/2021 10  5 - 15 Final   Performed at [facility], [address], [place], [place] [address]  . Alcohol, Ethyl (B) 10/03/2021 <10  <10 mg/dL Final   Comment: (NOTE) Lowest detectable limit for serum alcohol is 10 mg/dL.  For medical purposes only. Performed at [facility], [address] [place], [place] [address]     Allergies: Latex, Fentanyl, Sulfa antibiotics, and Toradol [ketorolac tromethamine]  PTA Medications: (Not in a hospital admission)   Medical Decision Making  ***    Recommendations  Based on my evaluation the patient does not appear to have an emergency medical  condition.   Meta Hatchet, PA 02/07/22  11:25 PM

## 2022-02-08 LAB — COMPREHENSIVE METABOLIC PANEL
ALT: 18 U/L (ref 0–44)
AST: 17 U/L (ref 15–41)
Albumin: 3.9 g/dL (ref 3.5–5.0)
Alkaline Phosphatase: 60 U/L (ref 38–126)
Anion gap: 11 (ref 5–15)
BUN: 22 mg/dL — ABNORMAL HIGH (ref 6–20)
CO2: 23 mmol/L (ref 22–32)
Calcium: 9.1 mg/dL (ref 8.9–10.3)
Chloride: 99 mmol/L (ref 98–111)
Creatinine, Ser: 1.18 mg/dL — ABNORMAL HIGH (ref 0.44–1.00)
GFR, Estimated: 56 mL/min — ABNORMAL LOW (ref >60)
Glucose, Bld: 90 mg/dL (ref 70–99)
Potassium: 3.5 mmol/L (ref 3.5–5.1)
Sodium: 133 mmol/L — ABNORMAL LOW (ref 135–145)
Total Bilirubin: 0.5 mg/dL (ref 0.3–1.2)
Total Protein: 6.9 g/dL (ref 6.5–8.1)

## 2022-02-08 LAB — LIPID PANEL
Cholesterol: 240 mg/dL — ABNORMAL HIGH (ref 0–200)
HDL: 41 mg/dL (ref >40)
LDL Cholesterol: 167 mg/dL — ABNORMAL HIGH (ref 0–99)
Total CHOL/HDL Ratio: 5.9 RATIO
Triglycerides: 160 mg/dL — ABNORMAL HIGH (ref <150)
VLDL: 32 mg/dL (ref 0–40)

## 2022-02-08 LAB — TSH: TSH: 0.776 u[IU]/mL (ref 0.350–4.500)

## 2022-02-08 LAB — SARS CORONAVIRUS 2 BY RT PCR: SARS Coronavirus 2 by RT PCR: NEGATIVE

## 2022-02-08 LAB — ETHANOL: Alcohol, Ethyl (B): <10 mg/dL (ref <10)

## 2022-02-08 MED ORDER — NICOTINE 21 MG/24HR TD PT24
21.0000 mg | MEDICATED_PATCH | Freq: Every day | TRANSDERMAL | Status: DC
Start: 2022-02-08 — End: 2022-02-08

## 2022-02-08 NOTE — ED Provider Notes (Signed)
[facility]/OBS ASAP Discharge Summary  Date and Time: 02/08/2022 10:12 AM  Name: Wendy Cooper  MRN:  [id number]   Discharge Diagnoses:  Final diagnoses:  Moderate episode of recurrent major depressive disorder (HCC)  Passive suicidal ideations    Subjective:  Wendy Cooper reported " I need help with my medications and I recently relapsed.  I do not think this is the appropriate place for me, people are pacing and it makes me nervous."  Wendy Cooper is a 51 year old Caucasian female who was seen and evaluated face-to-face.  She is currently denying suicidal or homicidal ideations.  Denies auditory visual hallucinations.  She reports stressors related to family and physical health.  States she recently received a colostomy bag which she has had on for 7 months.  States she scheduled to have the band removed.  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  States she was followed by psychiatry and therapy in [place] however was hoping to get a refill on her controlled medications. Stated she hasn't been able to get her medication refilled in the past 2-3 months.   Wendy Cooper denied previous inpatient admissions.  Reported she has been taking Cymbalta and Remeron however is out of her Xanax.  We will make additional outpatient resources available.  Support, encouragement and  reassurance was provided.   Per admission assessment note: "Pt is a 51 year old divorced female who presents unaccompanied to [facility] reporting depressive symptoms and suicidal ideation. Pt says she has a history of depressive symptoms since childhood and that she was told by a provider she needs to be tested for ADHD. She reports feeling hopeless due to several stressors and says she would like to go to sleep and not wake up. She says, "I think my kids would be better off without me. Pt acknowledges symptoms including crying spells, loss of interest in usual pleasures, fatigue, irritability, decreased concentration, decreased  sleep, decreased appetite and feelings of worthlessness and hopelessness. She says she has attempted suicide in the past by cutting her arm and overdosing on pills. Pt denies current homicidal ideation or history of violence. Pt denies any history of auditory or visual hallucinations."    Stay Summary:   Total Time spent with patient: 15 minutes  Past Psychiatric History:  Past Medical History:  Past Medical History:  Diagnosis Date   Anxiety    Colitis    Hypertension    Insomnia    PTSD (post-traumatic stress disorder)    Renal disorder     Past Surgical History:  Procedure Laterality Date   BACK SURGERY     HIP SURGERY Right    LAPAROTOMY N/A 02/14/2021   Procedure: EXPLORATORY LAPAROTOMY;  Surgeon: Ralene Ok, MD;  Location: [place];  Service: General;  Laterality: N/A;  2.5 HRS - DOW   PARTIAL COLECTOMY N/A 02/14/2021   Procedure: PARTIAL COLECTOMY AND COLOSTOMY;  Surgeon: Ralene Ok, MD;  Location: [place];  Service: General;  Laterality: N/A;   TUBAL LIGATION     Family History:  Family History  Problem Relation Age of Onset   Cancer Mother    Alzheimer's disease Father    Family Psychiatric History:  Social History:  Social History   Substance and Sexual Activity  Alcohol Use Not Currently     Social History   Substance and Sexual Activity  Drug Use Not Currently    Social History   Socioeconomic History   Marital status: Divorced    Spouse name: Not on file   Number  of children: Not on file   Years of education: Not on file   Highest education level: Not on file  Occupational History   Not on file  Tobacco Use   Smoking status: Former    Packs/day: 0.50    Types: Cigarettes    Quit date: 10/13/2020    Years since quitting: 1.3   Smokeless tobacco: Never  Vaping Use   Vaping Use: Never used  Substance and Sexual Activity   Alcohol use: Not Currently   Drug use: Not Currently   Sexual activity: Not on file  Other Topics Concern   Not on  file  Social History Narrative   Not on file   Social Determinants of Health   Financial Resource Strain: Not on file  Food Insecurity: Not on file  Transportation Needs: Not on file  Physical Activity: Not on file  Stress: Not on file  Social Connections: Not on file   SDOH:  SDOH Screenings   Alcohol Screen: Not on file  Depression (PHQ2-9): Not on file  Financial Resource Strain: Not on file  Food Insecurity: Not on file  Housing: Not on file  Physical Activity: Not on file  Social Connections: Not on file  Stress: Not on file  Tobacco Use: Medium Risk   Smoking Tobacco Use: Former   Smokeless Tobacco Use: Never   Passive Exposure: Not on file  Transportation Needs: Not on file    Tobacco Cessation:  N/A, patient does not currently use tobacco products  Current Medications:  Current Facility-Administered Medications  Medication Dose Route Frequency Provider Last Rate Last Admin   acetaminophen (TYLENOL) tablet 650 mg  650 mg Oral Q6H PRN Nwoko, Uchenna E, PA       alum & mag hydroxide-simeth (MAALOX/MYLANTA) 200-200-20 MG/5ML suspension 30 mL  30 mL Oral Q4H PRN Nwoko, Uchenna E, PA       hydrOXYzine (ATARAX) tablet 25 mg  25 mg Oral TID PRN Nwoko, Uchenna E, PA   25 mg at 02/07/22 2350   magnesium hydroxide (MILK OF MAGNESIA) suspension 30 mL  30 mL Oral Daily PRN Nwoko, Uchenna E, PA       nicotine (NICODERM CQ - dosed in mg/24 hours) patch 21 mg  21 mg Transdermal Daily Derrill Center, NP       traZODone (DESYREL) tablet 50 mg  50 mg Oral QHS PRN Nwoko, Uchenna E, PA   50 mg at 02/07/22 2350   Current Outpatient Medications  Medication Sig Dispense Refill   acetaminophen (TYLENOL) 500 MG tablet Take 2 tablets (1,000 mg total) by mouth every 6 (six) hours as needed for mild pain or moderate pain. 30 tablet 0   alprazolam (XANAX) 2 MG tablet Take 1 mg by mouth daily.     bisoprolol-hydrochlorothiazide (ZIAC) 10-6.25 MG tablet Take 1 tablet by mouth at bedtime.      cephALEXin (KEFLEX) 500 MG capsule Take 1 capsule (500 mg total) by mouth 2 (two) times daily. 20 capsule 0   DULoxetine (CYMBALTA) 20 MG capsule Take 20 mg by mouth daily.     mirtazapine (REMERON) 30 MG tablet Take 30 mg by mouth at bedtime.     naproxen (NAPROSYN) 500 MG tablet Take 1 tablet (500 mg total) by mouth 2 (two) times daily as needed for mild pain.     ondansetron (ZOFRAN ODT) 4 MG disintegrating tablet 4mg  ODT q4 hours prn nausea/vomit 10 tablet 0   oxyCODONE-acetaminophen (PERCOCET) 10-325 MG tablet Take 1 tablet by  mouth 3 (three) times daily as needed for pain.     pantoprazole (PROTONIX) 40 MG tablet TAKE 1 TABLET BY MOUTH ONCE DAILY 30 tablet 0   traZODone (DESYREL) 50 MG tablet TAKE 1 TO 2 TABLETS AT BEDTIME AS NEEDED FOR INSOMNIA 180 tablet 2    PTA Medications: (Not in a hospital admission)   Musculoskeletal  Strength & Muscle Tone: within normal limits Gait & Station: normal Patient leans: N/A  Psychiatric Specialty Exam  Presentation  General Appearance: Appropriate for Environment; Casual  Eye Contact:Good  Speech:Clear and Coherent; Normal Rate  Speech Volume:Normal  Handedness:Right   Mood and Affect  Mood:Anxious; Depressed  Affect:Depressed; Tearful; Congruent   Thought Process  Thought Processes:Disorganized; Goal Directed  Descriptions of Associations:Tangential  Orientation:Full (Time, Place and Person)  Thought Content:Scattered; Tangential; WDL  Diagnosis of Schizophrenia or Schizoaffective disorder in past: No    Hallucinations:Hallucinations: None  Ideas of Reference:None  Suicidal Thoughts:Suicidal Thoughts: Yes, Passive SI Passive Intent and/or Plan: Without Intent; Without Plan (Patient states that if she died tomorrow, she would not care.)  Homicidal Thoughts:Homicidal Thoughts: No   Sensorium  Memory:Immediate Good; Recent Good; Remote Fair  Judgment:Fair  Insight:Fair   Executive Functions   Concentration:Fair  Attention Span:Fair  Recall:Fair  Fund of Knowledge:Good  Language:Good   Psychomotor Activity  Psychomotor Activity:Psychomotor Activity: Restlessness   Assets  Assets:Communication Skills; Desire for Improvement; Housing; Social Support   Sleep  Sleep:Sleep: Fair   Nutritional Assessment (For OBS and [facility] admissions only) Has the patient had a weight loss or gain of 10 pounds or more in the last 3 months?: No Has the patient had a decrease in food intake/or appetite?: Yes Does the patient have dental problems?: No Does the patient have eating habits or behaviors that may be indicators of an eating disorder including binging or inducing vomiting?: No Has the patient recently lost weight without trying?: 0 Has the patient been eating poorly because of a decreased appetite?: 1 Malnutrition Screening Tool Score: 1    Physical Exam  Physical Exam Vitals and nursing note reviewed.  Cardiovascular:     Rate and Rhythm: Normal rate and regular rhythm.  Skin:    General: Skin is dry.  Neurological:     Mental Status: She is alert and oriented to person, place, and time.  Psychiatric:        Mood and Affect: Mood normal.        Behavior: Behavior normal.   Review of Systems  Constitutional: Negative.   Eyes: Negative.   Cardiovascular: Negative.   Genitourinary: Negative.   Skin: Negative.   Psychiatric/Behavioral:  Positive for substance abuse. Negative for depression and suicidal ideas. The patient is nervous/anxious.   All other systems reviewed and are negative. Blood pressure 106/78, pulse 86, temperature 98.3 F (36.8 C), temperature source Oral, resp. rate 16, SpO2 97 %. There is no height or weight on file to calculate BMI.  Demographic Factors:  Caucasian  Loss Factors: NA  Historical Factors: Family history of mental illness or substance abuse and Impulsivity  Risk Reduction Factors:   Positive social support and Positive  therapeutic relationship  Continued Clinical Symptoms:  Depression:   Comorbid alcohol abuse/dependence Impulsivity Insomnia Alcohol/Substance Abuse/Dependencies  Cognitive Features That Contribute To Risk:  Closed-mindedness    Suicide Risk:  Minimal: No identifiable suicidal ideation.  Patients presenting with no risk factors but with morbid ruminations; may be classified as minimal risk based on the severity of the depressive symptoms  Plan Of Care/Follow-up recommendations:  Activity:  as tolerated Diet:  heart healthy  Disposition: Take all medications as prescribed. Keep all follow-up appointments as scheduled.  Do not consume alcohol or use illegal drugs while on prescription medications. Report any adverse effects from your medications to your primary care provider promptly.  In the event of recurrent symptoms or worsening symptoms, call 911, a crisis hotline, or go to the nearest emergency department for evaluation.    Derrill Center, NP 02/08/2022, 10:12 AM

## 2022-02-08 NOTE — ED Notes (Signed)
Discharge instructions provided and Pt stated understanding. Pt alert, orient and ambulatory prior to d/c from facility. Personal belongings returned from locker number 77. Pt escorted to the front lobby to d/c from facility. Colostomy bags given back at time of d/c. Pt's car in the parking lot. Safety maintained.

## 2022-02-08 NOTE — ED Notes (Signed)
Patient resting quietly in bed with eyes closed, no S/S of distress. Respirations even and unlabored. Will contiune to monitor for safety.

## 2022-02-08 NOTE — ED Notes (Signed)
Pt requested nicotine patch. Informed provider, nicotine patch ordered per request. Pt provided breakfast and coffee. Phone at the bedside in order to obtain numbers. Phone replaced back into the locker per staff member.

## 2022-02-08 NOTE — Discharge Instructions (Addendum)
Take all medications as prescribed. Keep all follow-up appointments as scheduled.  Do not consume alcohol or use illegal drugs while on prescription medications. Report any adverse effects from your medications to your primary care provider promptly.  In the event of recurrent symptoms or worsening symptoms, call 911, a crisis hotline, or go to the nearest emergency department for evaluation.   
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
## 2022-02-08 NOTE — ED Notes (Signed)
Pt currently resting on pull out bed/chair with eyes closed. No distress observed. Safety maintained and will continue to monitor.

## 2022-02-25 ENCOUNTER — Telehealth (HOSPITAL_COMMUNITY): Payer: Self-pay

## 2022-02-25 NOTE — BH Assessment (Signed)
Care Management - [facility] Follow Up Discharges   Writer attempted to make contact with patient today and was unsuccessful.  No voicemail, hone just rang.   Per chart review, patient was provided with outpatient resources.

## 2022-03-13 ENCOUNTER — Encounter (HOSPITAL_COMMUNITY): Payer: Self-pay | Admitting: Nurse Practitioner

## 2022-03-20 ENCOUNTER — Encounter (HOSPITAL_COMMUNITY): Payer: Self-pay | Admitting: Nurse Practitioner

## 2022-03-31 ENCOUNTER — Encounter (HOSPITAL_COMMUNITY): Payer: Self-pay | Admitting: Nurse Practitioner

## 2022-04-07 DEATH — deceased

## 2022-06-10 IMAGING — DX DG CHEST 1V
1 series · 1 of 1 positions shown · non-contrast
Comparison: CT AP, 02/11/2021.

CLINICAL DATA: Poor historian.  Unresponsive.

EXAM:
CHEST  1 VIEW

[x chest ap]
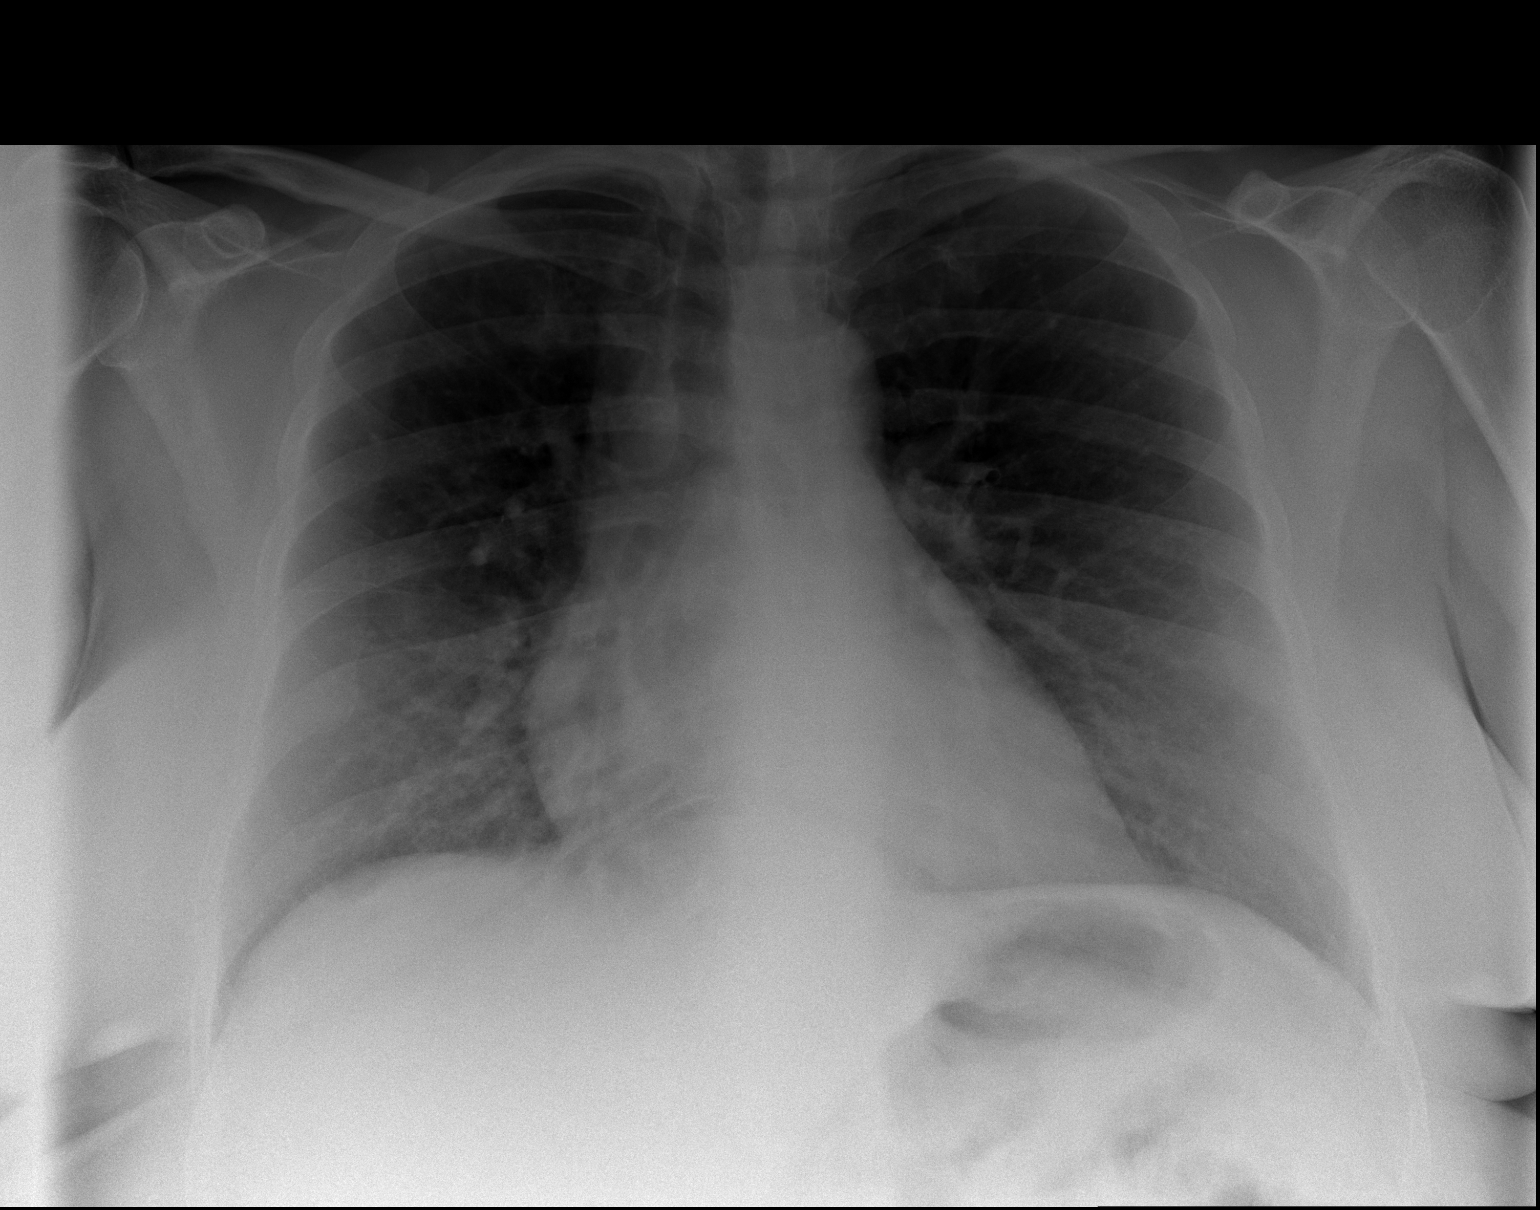

[1 of 1 positions shown; findings below may reference images not displayed]

FINDINGS: Cardiomediastinal silhouette is within normal limits given
technique, patient positioning and degree of inflation. No focal
consolidation or mass. No pleural effusion or pneumothorax. No acute
displaced fracture.
IMPRESSION: No acute cardiopulmonary process.
